# Patient Record
Sex: Female | Born: 1990 | Race: White | Hispanic: Yes | Marital: Married | Smoking: Current every day smoker
Health system: Southern US, Community
[De-identification: ages and names within clinical notes are randomized; demographics above are authoritative.]

## PROBLEM LIST (undated history)

## (undated) ENCOUNTER — Ambulatory Visit: Admission: EM | Payer: Medicaid Other

## (undated) DIAGNOSIS — R519 Headache, unspecified: Secondary | ICD-10-CM

## (undated) DIAGNOSIS — F32A Depression, unspecified: Secondary | ICD-10-CM

## (undated) DIAGNOSIS — J45909 Unspecified asthma, uncomplicated: Secondary | ICD-10-CM

## (undated) DIAGNOSIS — D259 Leiomyoma of uterus, unspecified: Secondary | ICD-10-CM

## (undated) DIAGNOSIS — F329 Major depressive disorder, single episode, unspecified: Secondary | ICD-10-CM

## (undated) DIAGNOSIS — O24419 Gestational diabetes mellitus in pregnancy, unspecified control: Secondary | ICD-10-CM

## (undated) DIAGNOSIS — G4730 Sleep apnea, unspecified: Secondary | ICD-10-CM

## (undated) DIAGNOSIS — D649 Anemia, unspecified: Secondary | ICD-10-CM

## (undated) DIAGNOSIS — F909 Attention-deficit hyperactivity disorder, unspecified type: Secondary | ICD-10-CM

## (undated) DIAGNOSIS — F419 Anxiety disorder, unspecified: Secondary | ICD-10-CM

## (undated) DIAGNOSIS — G932 Benign intracranial hypertension: Secondary | ICD-10-CM

## (undated) DIAGNOSIS — R160 Hepatomegaly, not elsewhere classified: Secondary | ICD-10-CM

## (undated) DIAGNOSIS — F429 Obsessive-compulsive disorder, unspecified: Secondary | ICD-10-CM

## (undated) DIAGNOSIS — F4310 Post-traumatic stress disorder, unspecified: Secondary | ICD-10-CM

## (undated) DIAGNOSIS — K760 Fatty (change of) liver, not elsewhere classified: Secondary | ICD-10-CM

## (undated) HISTORY — DX: Unspecified asthma, uncomplicated: J45.909

## (undated) HISTORY — DX: Depression, unspecified: F32.A

## (undated) HISTORY — DX: Anemia, unspecified: D64.9

## (undated) HISTORY — DX: Leiomyoma of uterus, unspecified: D25.9

## (undated) HISTORY — DX: Hepatomegaly, not elsewhere classified: R16.0

## (undated) HISTORY — DX: Sleep apnea, unspecified: G47.30

## (undated) HISTORY — DX: Anxiety disorder, unspecified: F41.9

---
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
## 2013-03-01 HISTORY — PX: TUBAL LIGATION: SHX77

## 2019-03-02 DIAGNOSIS — J189 Pneumonia, unspecified organism: Secondary | ICD-10-CM

## 2019-03-02 HISTORY — DX: Pneumonia, unspecified organism: J18.9

## 2019-05-28 ENCOUNTER — Encounter (HOSPITAL_COMMUNITY): Payer: Self-pay | Admitting: Emergency Medicine

## 2019-05-28 ENCOUNTER — Emergency Department (HOSPITAL_COMMUNITY)
Admission: EM | Admit: 2019-05-28 | Discharge: 2019-05-29 | Disposition: A | Discharge status: Home / Self Care | Payer: Medicaid Other | Attending: Emergency Medicine | Admitting: Emergency Medicine

## 2019-05-28 ENCOUNTER — Other Ambulatory Visit: Payer: Self-pay

## 2019-05-28 DIAGNOSIS — R1110 Vomiting, unspecified: Secondary | ICD-10-CM | POA: Diagnosis not present

## 2019-05-28 DIAGNOSIS — F17210 Nicotine dependence, cigarettes, uncomplicated: Secondary | ICD-10-CM | POA: Diagnosis not present

## 2019-05-28 DIAGNOSIS — R197 Diarrhea, unspecified: Secondary | ICD-10-CM | POA: Diagnosis not present

## 2019-05-28 DIAGNOSIS — R10817 Generalized abdominal tenderness: Secondary | ICD-10-CM | POA: Insufficient documentation

## 2019-05-28 DIAGNOSIS — R112 Nausea with vomiting, unspecified: Secondary | ICD-10-CM | POA: Diagnosis not present

## 2019-05-28 DIAGNOSIS — K769 Liver disease, unspecified: Secondary | ICD-10-CM | POA: Diagnosis not present

## 2019-05-28 DIAGNOSIS — K7689 Other specified diseases of liver: Secondary | ICD-10-CM | POA: Diagnosis not present

## 2019-05-28 LAB — CBC
HCT: 43.7 % (ref 36.0–46.0)
Hemoglobin: 14.5 g/dL (ref 12.0–15.0)
MCH: 30.5 pg (ref 26.0–34.0)
MCHC: 33.2 g/dL (ref 30.0–36.0)
MCV: 91.8 fL (ref 80.0–100.0)
Platelets: 384 10*3/uL (ref 150–400)
RBC: 4.76 MIL/uL (ref 3.87–5.11)
RDW: 13 % (ref 11.5–15.5)
WBC: 14.8 10*3/uL — ABNORMAL HIGH (ref 4.0–10.5)
nRBC: 0 % (ref 0.0–0.2)

## 2019-05-28 LAB — URINALYSIS, ROUTINE W REFLEX MICROSCOPIC
Bilirubin Urine: NEGATIVE
Glucose, UA: NEGATIVE mg/dL
Hgb urine dipstick: NEGATIVE
Ketones, ur: NEGATIVE mg/dL
Leukocytes,Ua: NEGATIVE
Nitrite: NEGATIVE
Protein, ur: 100 mg/dL — AB
Specific Gravity, Urine: 1.027 (ref 1.005–1.030)
pH: 6 (ref 5.0–8.0)

## 2019-05-28 LAB — COMPREHENSIVE METABOLIC PANEL
ALT: 66 U/L — ABNORMAL HIGH (ref 0–44)
AST: 37 U/L (ref 15–41)
Albumin: 4.4 g/dL (ref 3.5–5.0)
Alkaline Phosphatase: 64 U/L (ref 38–126)
Anion gap: 10 (ref 5–15)
BUN: 7 mg/dL (ref 6–20)
CO2: 24 mmol/L (ref 22–32)
Calcium: 9.3 mg/dL (ref 8.9–10.3)
Chloride: 102 mmol/L (ref 98–111)
Creatinine, Ser: 0.66 mg/dL (ref 0.44–1.00)
GFR calc Af Amer: >60 mL/min (ref >60)
GFR calc non Af Amer: >60 mL/min (ref >60)
Glucose, Bld: 109 mg/dL — ABNORMAL HIGH (ref 70–99)
Potassium: 4 mmol/L (ref 3.5–5.1)
Sodium: 136 mmol/L (ref 135–145)
Total Bilirubin: 0.8 mg/dL (ref 0.3–1.2)
Total Protein: 7.8 g/dL (ref 6.5–8.1)

## 2019-05-28 LAB — LIPASE, BLOOD: Lipase: 16 U/L (ref 11–51)

## 2019-05-28 LAB — I-STAT BETA HCG BLOOD, ED (MC, WL, AP ONLY): I-stat hCG, quantitative: <5 m[IU]/mL (ref <5)

## 2019-05-28 MED ORDER — SODIUM CHLORIDE 0.9% FLUSH: 3.0000 mL | Freq: Once | INTRAVENOUS | Status: DC

## 2019-05-28 NOTE — ED Triage Notes (Signed)
Patient reports upper/mid abdominal pain with emesis and diarrhea , denies fever or chills .

## 2019-05-29 ENCOUNTER — Encounter (HOSPITAL_COMMUNITY): Payer: Self-pay | Admitting: Radiology

## 2019-05-29 ENCOUNTER — Emergency Department (HOSPITAL_COMMUNITY): Payer: Medicaid Other

## 2019-05-29 DIAGNOSIS — R197 Diarrhea, unspecified: Secondary | ICD-10-CM | POA: Diagnosis not present

## 2019-05-29 DIAGNOSIS — R112 Nausea with vomiting, unspecified: Secondary | ICD-10-CM | POA: Diagnosis not present

## 2019-05-29 DIAGNOSIS — R1110 Vomiting, unspecified: Secondary | ICD-10-CM | POA: Diagnosis not present

## 2019-05-29 DIAGNOSIS — K769 Liver disease, unspecified: Secondary | ICD-10-CM | POA: Diagnosis not present

## 2019-05-29 MED ORDER — IOHEXOL 300 MG/ML  SOLN
80.0000 mL | Freq: Once | INTRAMUSCULAR | Status: AC | PRN
  Administered 2019-05-29: 80 mL via INTRAVENOUS

## 2019-05-29 MED ORDER — FAMOTIDINE 20 MG PO TABS: 20.0000 mg | ORAL_TABLET | Freq: Two times a day (BID) | ORAL | 0 refills | Status: DC

## 2019-05-29 MED ORDER — FAMOTIDINE IN NACL 20-0.9 MG/50ML-% IV SOLN
20.0000 mg | Freq: Once | INTRAVENOUS | Status: AC
  Administered 2019-05-29: 07:00:00 20 mg via INTRAVENOUS
  Filled 2019-05-29: qty 50

## 2019-05-29 MED ORDER — ONDANSETRON HCL 4 MG/2ML IJ SOLN
4.0000 mg | Freq: Once | INTRAMUSCULAR | Status: AC
  Administered 2019-05-29: 05:00:00 4 mg via INTRAVENOUS
  Filled 2019-05-29: qty 2

## 2019-05-29 MED ORDER — SODIUM CHLORIDE 0.9 % IV BOLUS
1000.0000 mL | Freq: Once | INTRAVENOUS | Status: AC
  Administered 2019-05-29: 05:00:00 1000 mL via INTRAVENOUS

## 2019-05-29 MED ORDER — ONDANSETRON 4 MG PO TBDP: 4.0000 mg | ORAL_TABLET | Freq: Three times a day (TID) | ORAL | 0 refills | Status: DC | PRN

## 2019-05-29 MED ORDER — MORPHINE SULFATE (PF) 4 MG/ML IV SOLN
4.0000 mg | Freq: Once | INTRAVENOUS | Status: AC
  Administered 2019-05-29: 4 mg via INTRAVENOUS
  Filled 2019-05-29: qty 1

## 2019-05-29 NOTE — Discharge Instructions (Signed)
Thank you for allowing me to care for you today in the Emergency Department.   Let 1 tablet of Zofran dissolve under your tongue every 8 hours as needed for nausea or vomiting.  Follow a bland diet for the next few days to help with your symptoms.  You can also take 1 tablet of Pepcid by mouth 2 times daily to help with upset stomach.  You can call the number on your discharge paperwork to get established with a primary care provider or follow-up with gastroenterology to have the area on your liver further evaluated.  Return to the emergency department if your skin turns yellow, if you have uncontrollable vomiting despite taking Zofran, if you start having high fevers, if you stop producing urine, or develop other new, concerning symptoms.

## 2019-05-29 NOTE — ED Notes (Signed)
Pt taken to CT.

## 2019-05-29 NOTE — ED Provider Notes (Signed)
[facility] EMERGENCY DEPARTMENT Provider Note   CSN: [id number] Arrival date & time: 05/28/19  2113     History Chief Complaint  Patient presents with  . Abdominal Pain    Joyce Swanson is a 29 y.o. female with no chronic medical conditions who presents to the emergency department with a chief complaint of abdominal pain.  The patient reports constant upper abdominal pain that began suddenly at 0500 yesterday, almost 24 hours ago.  Pain is nonradiating, but she does feel as if it intermittently moves all over her abdomen.  She characterizes the pain as crampy and sharp.  She was able to keep down [place] fries earlier today without vomiting.  Pain did not seem to be worse after eating.  No other known aggravating or alleviating factors.  She reports 2 episodes of nonbilious vomiting.  She reports that the second episode contained a few streaks of bright red blood.  She has also had chills since onset of symptoms as well as nonbloody diarrhea for the last couple of days.  No fever, dysuria, back pain, constipation, cough, shortness of breath, or URI symptoms.  No melena or hematochezia.  She has been having vaginal discharge, but reports that this is her baseline.  No concerns for STIs and the patient has not been sexually active since December 2020.  No treatment prior to arrival.  No suspicious food intake.  No sick contacts.   She is a current, every day smoker.  She denies recent NSAID use.  Denies recent alcohol use.  No other illicit or recreational substance use.  Surgical history includes cesarean section.  No language interpreter was used.       History reviewed. No pertinent past medical history.  There are no problems to display for this patient.   History reviewed. No pertinent surgical history.   OB History   No obstetric history on file.     No family history on file.  Social History   Tobacco Use  . Smoking status: Current Every Day Smoker    . Smokeless tobacco: Never Used  Substance Use Topics  . Alcohol use: Yes  . Drug use: Yes    Home Medications Prior to Admission medications   Medication Sig Start Date End Date Taking? Authorizing Provider  famotidine (PEPCID) 20 MG tablet Take 1 tablet (20 mg total) by mouth 2 (two) times daily. 05/29/19   Xochitl Egle A, PA-C  ondansetron (ZOFRAN ODT) 4 MG disintegrating tablet Take 1 tablet (4 mg total) by mouth every 8 (eight) hours as needed. 05/29/19   Magaline Steinberg A, PA-C    Allergies    Patient has no known allergies.  Review of Systems   Review of Systems  Constitutional: Positive for chills. Negative for activity change, diaphoresis and fever.  HENT: Negative for congestion and sore throat.   Respiratory: Negative for shortness of breath and wheezing.   Cardiovascular: Negative for chest pain.  Gastrointestinal: Positive for abdominal pain, diarrhea, nausea and vomiting.  Genitourinary: Positive for vaginal discharge (chronic). Negative for decreased urine volume, dysuria, flank pain, frequency, hematuria, menstrual problem and urgency.  Musculoskeletal: Negative for back pain, gait problem, myalgias, neck pain and neck stiffness.  Skin: Negative for rash.  Allergic/Immunologic: Negative for immunocompromised state.  Neurological: Negative for dizziness, seizures, syncope, weakness, numbness and headaches.  Psychiatric/Behavioral: Negative for confusion.    Physical Exam Updated Vital Signs BP (!) 125/96   Pulse 82   Temp 98.2 F (36.8 C) (Oral)  Resp 17   Ht 5\' 1"  (1.549 m)   Wt 75 kg   LMP 05/14/2019   SpO2 97%   BMI 31.24 kg/m   Physical Exam Vitals and nursing note reviewed.  Constitutional:      General: She is not in acute distress. HENT:     Head: Normocephalic.  Eyes:     Conjunctiva/sclera: Conjunctivae normal.  Neck:     Vascular: No carotid bruit.  Cardiovascular:     Rate and Rhythm: Normal rate and regular rhythm.     Heart sounds:  No murmur. No friction rub. No gallop.   Pulmonary:     Effort: Pulmonary effort is normal. No respiratory distress.     Breath sounds: No stridor. No wheezing, rhonchi or rales.  Chest:     Chest wall: No tenderness.  Abdominal:     General: There is no distension.     Palpations: Abdomen is soft.     Tenderness: There is abdominal tenderness.     Comments: Diffusely tender to palpation throughout the abdomen.  Maximal tenderness is in the epigastric region.  Abdomen is soft and nondistended.  No rebound or guarding.  Hypoactive bowel sounds in all 4 quadrants.  Negative Murphy sign.  No focal tenderness over McBurney's point.  Left CVA tenderness.  No right CVA tenderness.  Musculoskeletal:     Cervical back: Normal range of motion and neck supple. No rigidity or tenderness.  Lymphadenopathy:     Cervical: No cervical adenopathy.  Skin:    General: Skin is warm.     Coloration: Skin is not jaundiced.     Findings: No rash.  Neurological:     Mental Status: She is alert.  Psychiatric:        Behavior: Behavior normal.     ED Results / Procedures / Treatments   Labs (all labs ordered are listed, but only abnormal results are displayed) Labs Reviewed  COMPREHENSIVE METABOLIC PANEL - Abnormal; Notable for the following components:      Result Value   Glucose, Bld 109 (*)    ALT 66 (*)    All other components within normal limits  CBC - Abnormal; Notable for the following components:   WBC 14.8 (*)    All other components within normal limits  URINALYSIS, ROUTINE W REFLEX MICROSCOPIC - Abnormal; Notable for the following components:   APPearance HAZY (*)    Protein, ur 100 (*)    Bacteria, UA RARE (*)    All other components within normal limits  LIPASE, BLOOD  I-STAT BETA HCG BLOOD, ED ([facility], [facility], [facility] ONLY)    EKG None  Radiology CT ABDOMEN PELVIS W CONTRAST  Result Date: 05/29/2019 CLINICAL DATA:  Nausea and vomiting. Intermittent upper and lower abdominal pain for 3  days. EXAM: CT ABDOMEN AND PELVIS WITH CONTRAST TECHNIQUE: Multidetector CT imaging of the abdomen and pelvis was performed using the standard protocol following bolus administration of intravenous contrast. CONTRAST:  86mL OMNIPAQUE IOHEXOL 300 MG/ML  SOLN COMPARISON:  None. FINDINGS: Lower chest:  No contributory findings. Hepatobiliary: Marked hepatic steatosis. There is a rounded hypervascular area in the upper left liver measuring 15 mm. No noted cirrhotic changes. No evidence of biliary obstruction or stone. Pancreas: Unremarkable. Spleen: Unremarkable. Adrenals/Urinary Tract: Negative adrenals. No hydronephrosis or stone. Unremarkable bladder. Stomach/Bowel:  No obstruction. No appendicitis. Vascular/Lymphatic: No acute vascular abnormality. No mass or adenopathy. Reproductive:Somewhat globular appearance of the uterus which may be from adenomyosis or recent delivery. There  may have likely been a prior Caesarean section. Other: No ascites or pneumoperitoneum. Musculoskeletal: No acute abnormalities. IMPRESSION: No acute finding. Hypervascular nodule in the upper left liver measuring 15 mm. Based on demographics this usually reflects hemangioma or adenoma. There is marked hepatic steatosis and follow-up is recommended. Electronically Signed   By: Monte Fantasia M.D.   On: 05/29/2019 05:29    Procedures Procedures (including critical care time)  Medications Ordered in ED Medications  sodium chloride flush (NS) 0.9 % injection 3 mL (3 mLs Intravenous Not Given 05/29/19 0317)  sodium chloride 0.9 % bolus 1,000 mL (0 mLs Intravenous Stopped 05/29/19 0635)  ondansetron (ZOFRAN) injection 4 mg (4 mg Intravenous Given 05/29/19 0439)  morphine 4 MG/ML injection 4 mg (4 mg Intravenous Given 05/29/19 0439)  iohexol (OMNIPAQUE) 300 MG/ML solution 80 mL (80 mLs Intravenous Contrast Given 05/29/19 0438)  famotidine (PEPCID) IVPB 20 mg premix (20 mg Intravenous New Bag/Given 05/29/19 [id number])    ED Course  I have  reviewed the triage vital signs and the nursing notes.  Pertinent labs & imaging results that were available during my care of the patient were reviewed by me and considered in my medical decision making (see chart for details).    MDM Rules/Calculators/A&P                       29 year old female with no significant past medical history presenting with nausea, vomiting, diarrhea, chills, and abdominal pain for the last 24 hours.  Mildly tachycardic on arrival with mild hypertension, but she is afebrile with no tachypnea or hypoxia.  On exam, she is diffusely tender to palpation throughout the abdomen without rebound or guarding.  No GU complaints.  Labs are notable for minimally elevated ALT and leukocytosis of 14.8. UA with mild proteinuria.  Not concerning for infection.  CT abdomen pelvis demonstrating a hypervascular nodule in the left upper liver measuring 15 mm concerning for hemangioma versus adenoma.  There is also marked hepatic steatosis and follow-up is recommended.  The patient reports that she just recently moved to the area from [facility].  She reports that she had a CT abdomen pelvis performed within the last year and does not recall any abnormalities of the lungs there are mentioned on the read.  Unfortunately, these records are not available in care everywhere.  On reevaluation, tachycardia has resolved.  Patient reports that she is feeling much better after IV fluid bolus.  She has been successfully fluid challenge.  Discussed that symptoms could be a viral gastroenterologist given the family been ongoing for 24 hours, but could also be COVID-19.  She declined COVID-19 testing in the ER.  Doubt pyelonephritis, PID, appendicitis, cholecystitis, pancreatitis, ectopic pregnancy, or ovarian torsion.  Will discharge with symptomatic care and gastroenterology referral for follow-up.  She is also looking to get established with a PCP that she recently relocated to the area.  All  questions answered.  She is hemodynamically stable and in no acute distress.  Safe for discharge to home with outpatient follow-up.  Final Clinical Impression(s) / ED Diagnoses Final diagnoses:  Nausea vomiting and diarrhea  Liver nodule    Rx / DC Orders ED Discharge Orders         Ordered    ondansetron (ZOFRAN ODT) 4 MG disintegrating tablet  Every 8 hours PRN     05/29/19 0714    famotidine (PEPCID) 20 MG tablet  2 times daily  05/29/19 Grimsley, Legacy Carrender A, PA-C 05/29/19 [id number]    Merrily Pew, MD 05/30/19 [id number]

## 2019-05-29 NOTE — ED Notes (Signed)
Pt tolerated PO water 

## 2019-05-29 NOTE — ED Notes (Signed)
Patient verbalizes understanding of discharge instructions. Opportunity for questioning and answers were provided. Armband removed by staff, pt discharged from ED. Ambulated out to lobby  

## 2019-06-19 NOTE — Progress Notes (Deleted)
    New patient visit    Patient: Joyce Swanson   DOB: 06-17-1990   29 y.o. Female  MRN: [id number] Visit Date: 06/19/2019  Today's healthcare provider: Marcille Buffy, FNP  Subjective:   No chief complaint on file.   Joyce Swanson is a 29 y.o. female who presents today as a new patient to establish care.  HPI  ***  No past medical history on file. No past surgical history on file. No family status information on file.   No family history on file. Social History   Socioeconomic History  . Marital status: Divorced    Spouse name: Not on file  . Number of children: Not on file  . Years of education: Not on file  . Highest education level: Not on file  Occupational History  . Not on file  Tobacco Use  . Smoking status: Current Every Day Smoker  . Smokeless tobacco: Never Used  Substance and Sexual Activity  . Alcohol use: Yes  . Drug use: Yes  . Sexual activity: Not on file  Other Topics Concern  . Not on file  Social History Narrative  . Not on file   Social Determinants of Health   Financial Resource Strain:   . Difficulty of Paying Living Expenses:   Food Insecurity:   . Worried About Charity fundraiser in the Last Year:   . Arboriculturist in the Last Year:   Transportation Needs:   . Film/video editor (Medical):   Marland Kitchen Lack of Transportation (Non-Medical):   Physical Activity:   . Days of Exercise per Week:   . Minutes of Exercise per Session:   Stress:   . Feeling of Stress :   Social Connections:   . Frequency of Communication with Friends and Family:   . Frequency of Social Gatherings with Friends and Family:   . Attends Religious Services:   . Active Member of Clubs or Organizations:   . Attends Archivist Meetings:   Marland Kitchen Marital Status:    Outpatient Medications Prior to Visit  Medication Sig  . famotidine (PEPCID) 20 MG tablet Take 1 tablet (20 mg total) by mouth 2 (two) times daily.  . ondansetron (ZOFRAN ODT) 4 MG  disintegrating tablet Take 1 tablet (4 mg total) by mouth every 8 (eight) hours as needed.   No facility-administered medications prior to visit.   No Known Allergies  Patient Care Team: Patient, No Pcp Per as PCP - General (General Practice)  Review of Systems  {Show previous labs (optional):23779::" "}   Objective:    There were no vitals taken for this visit. Physical Exam ***  No results found for any visits on 06/25/19.    Assessment & Plan:    ***  No follow-ups on file.     {provider attestation***:1}   Marcille Buffy, [place] [phone] (phone) [phone] (fax)  [place]

## 2019-06-25 ENCOUNTER — Ambulatory Visit: Payer: Medicaid Other | Admitting: Adult Health

## 2019-07-10 ENCOUNTER — Ambulatory Visit: Payer: Medicaid Other | Admitting: Adult Health

## 2019-07-10 ENCOUNTER — Encounter: Payer: Self-pay | Admitting: Adult Health

## 2019-07-10 ENCOUNTER — Other Ambulatory Visit: Payer: Self-pay | Admitting: Gastroenterology

## 2019-07-10 ENCOUNTER — Other Ambulatory Visit: Payer: Self-pay

## 2019-07-10 VITALS — BP 136/94 | HR 88 | Temp 96.9°F | Resp 16 | Ht 61.0 in | Wt 155.4 lb

## 2019-07-10 DIAGNOSIS — Z862 Personal history of diseases of the blood and blood-forming organs and certain disorders involving the immune mechanism: Secondary | ICD-10-CM

## 2019-07-10 DIAGNOSIS — N920 Excessive and frequent menstruation with regular cycle: Secondary | ICD-10-CM | POA: Diagnosis not present

## 2019-07-10 DIAGNOSIS — R030 Elevated blood-pressure reading, without diagnosis of hypertension: Secondary | ICD-10-CM | POA: Diagnosis not present

## 2019-07-10 DIAGNOSIS — Z13220 Encounter for screening for lipoid disorders: Secondary | ICD-10-CM | POA: Diagnosis not present

## 2019-07-10 DIAGNOSIS — Z0000 Encounter for general adult medical examination without abnormal findings: Secondary | ICD-10-CM | POA: Diagnosis not present

## 2019-07-10 DIAGNOSIS — F419 Anxiety disorder, unspecified: Secondary | ICD-10-CM | POA: Diagnosis not present

## 2019-07-10 DIAGNOSIS — F322 Major depressive disorder, single episode, severe without psychotic features: Secondary | ICD-10-CM

## 2019-07-10 DIAGNOSIS — E559 Vitamin D deficiency, unspecified: Secondary | ICD-10-CM | POA: Insufficient documentation

## 2019-07-10 DIAGNOSIS — R45851 Suicidal ideations: Secondary | ICD-10-CM

## 2019-07-10 DIAGNOSIS — F4310 Post-traumatic stress disorder, unspecified: Secondary | ICD-10-CM | POA: Diagnosis not present

## 2019-07-10 DIAGNOSIS — R140 Abdominal distension (gaseous): Secondary | ICD-10-CM | POA: Diagnosis not present

## 2019-07-10 DIAGNOSIS — K769 Liver disease, unspecified: Secondary | ICD-10-CM | POA: Diagnosis not present

## 2019-07-10 DIAGNOSIS — Z86018 Personal history of other benign neoplasm: Secondary | ICD-10-CM | POA: Diagnosis not present

## 2019-07-10 DIAGNOSIS — R1084 Generalized abdominal pain: Secondary | ICD-10-CM | POA: Diagnosis not present

## 2019-07-10 DIAGNOSIS — Z1329 Encounter for screening for other suspected endocrine disorder: Secondary | ICD-10-CM | POA: Diagnosis not present

## 2019-07-10 DIAGNOSIS — R5383 Other fatigue: Secondary | ICD-10-CM | POA: Diagnosis not present

## 2019-07-10 DIAGNOSIS — Z91419 Personal history of unspecified adult abuse: Secondary | ICD-10-CM

## 2019-07-10 LAB — POCT URINALYSIS DIPSTICK
Bilirubin, UA: NEGATIVE
Glucose, UA: NEGATIVE
Ketones, UA: NEGATIVE
Leukocytes, UA: NEGATIVE
Nitrite, UA: NEGATIVE
Protein, UA: NEGATIVE
Spec Grav, UA: 1.01 (ref 1.010–1.025)
Urobilinogen, UA: 0.2 E.U./dL
pH, UA: 8.5 — AB (ref 5.0–8.0)

## 2019-07-10 NOTE — Patient Instructions (Signed)
Psychiatric/Counseling Resources Discussed As Follows:  If Emergency please seek Emergency Room Care Immediately or Call 911.   [facility] Address:  [place], [place] [address] Phone: [phone] Website : [email or web address]   [facility] Address:  [address] [place], [place] [address] Phone: [phone] Fax: [phone] Website: [email or web address] How To Access Our Services Because our main goal is to meet the needs of our consumers, [facility] operates on a walk-in basis! To access services, there are just 3 easy steps: 1) Walk in any Monday, Wednesday or Friday between 8:00 am and 3:00 pm and complete our consumer paperwork 2) A Comprehensive Clinical Assessment (CCA) will be completed and appropriate service recommendations will be provided 3) Recommendations are sent to [facility] team members and the appropriate staff will call you within days. Advanced Access Open M - F, 8:00 am - 8:00 pm  Mental health crisis services for all age groups  Triage  Psychiatric Evaluations  Involuntary Commitments  [facility]  Address: [address], [place], [place] [address] Website : [email or web address] Walk in's accepted see web site or call for more information Phone : [phone] Also has [facility] [phone]    Psychology Today Find a therapist by searching online in your area or specialist by your diagnosis Website:  https://www.psychologytoday.com/us     Major Depressive Disorder, Adult Major depressive disorder (MDD) is a mental health condition. MDD often makes you feel sad, hopeless, or helpless. MDD can also cause symptoms in your body. MDD can affect your:  Work.  School.  Relationships.  Other normal activities. MDD can range from mild to very bad. It may occur once (single episode MDD). It can also occur many times (recurrent  MDD). The main symptoms of MDD often include:  Feeling sad, depressed, or irritable most of the time.  Loss of interest. MDD symptoms also include:  Sleeping too much or too little.  Eating too much or too little.  A change in your weight.  Feeling tired (fatigue) or having low energy.  Feeling worthless.  Feeling guilty.  Trouble making decisions.  Trouble thinking clearly.  Thoughts of suicide or harming others.  Feeling weak.  Feeling agitated.  Keeping yourself from being around other people (isolation). Follow these instructions at home: Activity  Do these things as told by your doctor: ? Go back to your normal activities. ? Exercise regularly. ? Spend time outdoors. Alcohol  Talk with your doctor about how alcohol can affect your antidepressant medicines.  Do not drink alcohol. Or, limit how much alcohol you drink. ? This means no more than 1 drink a day for nonpregnant women and 2 drinks a day for men. One drink equals one of these:  12 oz of beer.  5 oz of wine.  1 oz of hard liquor. General instructions  Take over-the-counter and prescription medicines only as told by your doctor.  Eat a healthy diet.  Get plenty of sleep.  Find activities that you enjoy. Make time to do them.  Think about joining a support group. Your doctor may be able to suggest a group for you.  Keep all follow-up visits as told by your doctor. This is important. Where to find more information:  [employer] on Mental Illness: ? www.nami.[place]: ? https://carter.com/  National Suicide Prevention Lifeline: ? 813-353-1869. This is free, 24-hour help. Contact a doctor if:  Your symptoms get worse.  You have  new symptoms. Get help right away if:  You self-harm.  You see, hear, taste, smell, or feel things that are not present (hallucinate). If you ever feel like you may hurt yourself or others, or have thoughts  about taking your own life, get help right away. You can go to your nearest emergency department or call:  Your local emergency services (911 in the [place]  A suicide crisis helpline, such as the National Suicide Prevention Lifeline: ? (325)115-5453. This is open 24 hours a day. This information is not intended to replace advice given to you by your health care provider. Make sure you discuss any questions you have with your health care provider. Document Revised: 01/28/2017 Document Reviewed: 11/02/2015 Elsevier Patient Education  2020 [employer].

## 2019-07-10 NOTE — Progress Notes (Deleted)
     Established patient visit   Patient: Joyce Swanson   DOB: Feb 13, 1991   29 y.o. Female  MRN: [id number] Visit Date: 07/10/2019  Today's healthcare provider: Marcille Buffy, FNP   No chief complaint on file.  Subjective    HPI ***  {Show patient history (optional):23778::" "}   Medications: Outpatient Medications Prior to Visit  Medication Sig  . famotidine (PEPCID) 20 MG tablet Take 1 tablet (20 mg total) by mouth 2 (two) times daily.  . ondansetron (ZOFRAN ODT) 4 MG disintegrating tablet Take 1 tablet (4 mg total) by mouth every 8 (eight) hours as needed.   No facility-administered medications prior to visit.    Review of Systems  Constitutional: Positive for activity change, appetite change, chills and fatigue.  HENT: Positive for congestion, dental problem, ear pain, facial swelling, sinus pressure, sneezing and sore throat.   Eyes: Positive for photophobia and itching.  Respiratory: Positive for apnea and cough.   Gastrointestinal: Positive for abdominal distention and abdominal pain.  Endocrine: Positive for cold intolerance.  Genitourinary: Positive for enuresis and vaginal discharge.  Musculoskeletal: Positive for back pain, myalgias, neck pain and neck stiffness.  Allergic/Immunologic: Positive for environmental allergies.  Neurological: Positive for weakness, light-headedness, numbness and headaches.  Psychiatric/Behavioral: Positive for agitation, confusion, decreased concentration, dysphoric mood and sleep disturbance. The patient is nervous/anxious.   All other systems reviewed and are negative.   {Show previous labs (optional):23779::" "}  Objective    There were no vitals taken for this visit. {Show previous vital signs (optional):23777::" "}  Physical Exam  ***  No results found for any visits on 07/10/19.  Assessment & Plan     ***  No follow-ups on file.      {provider attestation***:1}   Marcille Buffy, [place] [phone] (phone) [phone] (fax)  [place]
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
## 2019-07-11 ENCOUNTER — Telehealth: Payer: Self-pay | Admitting: Obstetrics and Gynecology

## 2019-07-11 DIAGNOSIS — K769 Liver disease, unspecified: Secondary | ICD-10-CM | POA: Diagnosis not present

## 2019-07-11 DIAGNOSIS — R1084 Generalized abdominal pain: Secondary | ICD-10-CM | POA: Diagnosis not present

## 2019-07-11 DIAGNOSIS — R140 Abdominal distension (gaseous): Secondary | ICD-10-CM | POA: Diagnosis not present

## 2019-07-11 NOTE — Telephone Encounter (Signed)
[facility] referring for Menorrhagia with regular cycle, History of uterine fibroid, est care. Desires female provider. Called and left voicemail for patient to call back to be scheduled.

## 2019-07-12 ENCOUNTER — Other Ambulatory Visit: Payer: Self-pay | Admitting: Gastroenterology

## 2019-07-12 DIAGNOSIS — K769 Liver disease, unspecified: Secondary | ICD-10-CM

## 2019-07-12 NOTE — Telephone Encounter (Signed)
Called and left voicemail for patient to call back to be scheduled. 

## 2019-07-12 NOTE — Addendum Note (Signed)
Addended by: Doreen Beam on: 07/12/2019 04:30 PM   Modules accepted: Level of Service

## 2019-07-16 DIAGNOSIS — R1084 Generalized abdominal pain: Secondary | ICD-10-CM | POA: Diagnosis not present

## 2019-07-16 DIAGNOSIS — R140 Abdominal distension (gaseous): Secondary | ICD-10-CM | POA: Diagnosis not present

## 2019-07-27 DIAGNOSIS — Z1159 Encounter for screening for other viral diseases: Secondary | ICD-10-CM | POA: Diagnosis not present

## 2019-08-01 DIAGNOSIS — R768 Other specified abnormal immunological findings in serum: Secondary | ICD-10-CM | POA: Diagnosis not present

## 2019-08-01 DIAGNOSIS — K2210 Ulcer of esophagus without bleeding: Secondary | ICD-10-CM | POA: Diagnosis not present

## 2019-08-01 DIAGNOSIS — K200 Eosinophilic esophagitis: Secondary | ICD-10-CM | POA: Diagnosis not present

## 2019-08-01 DIAGNOSIS — K3189 Other diseases of stomach and duodenum: Secondary | ICD-10-CM | POA: Diagnosis not present

## 2019-08-01 DIAGNOSIS — R140 Abdominal distension (gaseous): Secondary | ICD-10-CM | POA: Diagnosis not present

## 2019-08-01 DIAGNOSIS — K222 Esophageal obstruction: Secondary | ICD-10-CM | POA: Diagnosis not present

## 2019-08-01 DIAGNOSIS — R1084 Generalized abdominal pain: Secondary | ICD-10-CM | POA: Diagnosis not present

## 2019-08-01 DIAGNOSIS — K449 Diaphragmatic hernia without obstruction or gangrene: Secondary | ICD-10-CM | POA: Diagnosis not present

## 2019-08-01 DIAGNOSIS — K228 Other specified diseases of esophagus: Secondary | ICD-10-CM | POA: Diagnosis not present

## 2019-08-03 ENCOUNTER — Ambulatory Visit (INDEPENDENT_AMBULATORY_CARE_PROVIDER_SITE_OTHER): Payer: Medicaid Other | Admitting: Obstetrics and Gynecology

## 2019-08-03 ENCOUNTER — Encounter: Payer: Self-pay | Admitting: Obstetrics and Gynecology

## 2019-08-03 ENCOUNTER — Other Ambulatory Visit (HOSPITAL_COMMUNITY)
Admission: RE | Admit: 2019-08-03 | Discharge: 2019-08-03 | Disposition: A | Discharge status: Home / Self Care | Payer: Medicaid Other | Source: Ambulatory Visit | Attending: Obstetrics and Gynecology | Admitting: Obstetrics and Gynecology

## 2019-08-03 ENCOUNTER — Other Ambulatory Visit: Payer: Self-pay

## 2019-08-03 VITALS — BP 110/70 | Ht 61.0 in | Wt 149.2 lb

## 2019-08-03 DIAGNOSIS — D259 Leiomyoma of uterus, unspecified: Secondary | ICD-10-CM

## 2019-08-03 DIAGNOSIS — N920 Excessive and frequent menstruation with regular cycle: Secondary | ICD-10-CM | POA: Diagnosis not present

## 2019-08-03 DIAGNOSIS — Z124 Encounter for screening for malignant neoplasm of cervix: Secondary | ICD-10-CM | POA: Insufficient documentation

## 2019-08-03 NOTE — Patient Instructions (Signed)

## 2019-08-03 NOTE — Progress Notes (Signed)
Patient ID: Joyce Swanson, female   DOB: 1990/12/24, 29 y.o.   MRN: [id number]  Reason for Consult: Gynecologic Exam   Referred by Doreen Beam, F*  Subjective:     HPI:  Joyce Swanson is a 29 y.o. female.  She presents today with complaints of menorrhagia.  She reports that she has been having heavier menstrual cycles for the last 6 months.  She reports that she has been passing palm-sized blood clots having severe pain.  She reports that she had an ultrasound in [place] which showed that she had a enlarged fibroid uterus.  She did not have treatment for these fibroids in [place] because she recently moved here to [place]. She denies gushing or fluttering sensations of bleeding.  She denies having any accidents where the bleeding caused her to need to change her clothes.  She reports that she has occasionally missed work because of her heavy bleeding and pain on her periods.  She has no history of needing blood transfusions.  She does have a history of anemia.  Her most recent hemoglobin was within normal range.  She reports that she is sexually active.  She has no pain or bleeding with intercourse.  She has undergone a tubal ligation in the past.  She reports that she no longer desires to have children.  Gynecological History Menarche: 11 Menopause: Not applicable LMP: 0/53/9767 Describes periods as heavy Last pap smear: Reports last Pap smear was in October in [place] and was normal. History of STDs: Denies Sexually Active: Yes  Denies history of polyps or ovarian cyst.  Denies history of endometriosis or PCOS.  Obstetrical History G3 P3 History of 2 cesarean sections and 1 vaginal delivery.  Reports that she had gestational diabetes with every pregnancy.  She reports her pregnancies were also complicated by anemia.  Past Medical History:  Diagnosis Date  . Anemia   . Anxiety   . Asthma   . Depression   . Fibroid, uterine   . Liver mass   . Sleep apnea    Family  History  Problem Relation Age of Onset  . Diabetes Father   . Diabetes Paternal Aunt   . Diabetes Paternal Uncle   . Diabetes Paternal Grandmother    History reviewed. No pertinent surgical history.  Short Social History:  Social History   Tobacco Use  . Smoking status: Current Every Day Smoker  . Smokeless tobacco: Never Used  Substance Use Topics  . Alcohol use: Yes    No Known Allergies  Current Outpatient Medications  Medication Sig Dispense Refill  . pantoprazole (PROTONIX) 40 MG tablet Take 40 mg by mouth daily.     No current facility-administered medications for this visit.    Review of Systems  Constitutional: Negative for chills, fatigue, fever and unexpected weight change.  HENT: Negative for trouble swallowing.  Eyes: Negative for loss of vision.  Respiratory: Positive for cough. Negative for shortness of breath and wheezing.  Cardiovascular: Negative for chest pain, leg swelling, palpitations and syncope.  GI: Negative for abdominal pain, blood in stool, diarrhea, nausea and vomiting.  GU: Negative for difficulty urinating, dysuria, frequency and hematuria.  Musculoskeletal: Negative for back pain, leg pain and joint pain.  Skin: Negative for rash.  Neurological: Positive for dizziness, headaches and numbness. Negative for light-headedness and seizures.  Psychiatric: Positive for depressed mood. Negative for behavioral problem, confusion and sleep disturbance.        Objective:  Objective   Vitals:  08/03/19 1442  BP: 110/70  Weight: 149 lb 3.2 oz (67.7 kg)  Height: 5\' 1"  (1.549 m)   Body mass index is 28.19 kg/m.  Physical Exam Vitals and nursing note reviewed.  Constitutional:      Appearance: She is well-developed.  HENT:     Head: Normocephalic and atraumatic.  Eyes:     Pupils: Pupils are equal, round, and reactive to light.  Cardiovascular:     Rate and Rhythm: Normal rate and regular rhythm.  Pulmonary:     Effort: Pulmonary  effort is normal. No respiratory distress.  Genitourinary:    Comments: External: Vulva normal. No lesions noted.  Speculum examination:  Cervix normal . No blood in the vaginal vault. no discharge.  Bimanual examination: Uterus midline, enlarged fibroid uterus approximately 12-14 cm in size. Mobile. No adnexal masses. Some small CMT. Pelvis is not fixed. Skin:    General: Skin is warm and dry.  Neurological:     Mental Status: She is alert and oriented to person, place, and time.  Psychiatric:        Behavior: Behavior normal.        Thought Content: Thought content normal.        Judgment: Judgment normal.       Assessment/Plan:     29 year old with enlarged fibroid uterus and history of menorrhagia We will have patient return for a pelvic ultrasound to define the size and shape and location of her uterine fibroids. Discussed treatment options for fibroids including hormonal contraception, myomectomy, uterine fibroid embolization, and hysterectomy.  Discussed that recommendations for treatment will be based off of her desires as well as what is feasible with the size and location of the uterine fibroids.  We will discuss treatment options further at her next appointment.  Patient was provided with handouts with further information regarding uterine fibroids.  More than 30 minutes were spent face to face with the patient in the room, reviewing the medical record, labs and images, and coordinating care for the patient. The plan of management was discussed in detail and counseling was provided.   Adrian Prows MD [facility], [facility] 08/03/2019 3:27 PM

## 2019-08-09 LAB — CYTOLOGY - PAP
Comment: NORMAL
Diagnosis: NEGATIVE
Neisseria Gonorrhea: NEGATIVE

## 2019-08-11 ENCOUNTER — Other Ambulatory Visit: Payer: Self-pay

## 2019-08-11 ENCOUNTER — Ambulatory Visit
Admission: RE | Admit: 2019-08-11 | Discharge: 2019-08-11 | Disposition: A | Discharge status: Home / Self Care | Payer: Medicaid Other | Source: Ambulatory Visit | Attending: Gastroenterology | Admitting: Gastroenterology

## 2019-08-11 DIAGNOSIS — K7689 Other specified diseases of liver: Secondary | ICD-10-CM | POA: Diagnosis not present

## 2019-08-11 DIAGNOSIS — K760 Fatty (change of) liver, not elsewhere classified: Secondary | ICD-10-CM | POA: Diagnosis not present

## 2019-08-11 DIAGNOSIS — K769 Liver disease, unspecified: Secondary | ICD-10-CM

## 2019-08-11 MED ORDER — GADOBENATE DIMEGLUMINE 529 MG/ML IV SOLN
14.0000 mL | Freq: Once | INTRAVENOUS | Status: AC | PRN
  Administered 2019-08-11: 14 mL via INTRAVENOUS

## 2019-08-20 ENCOUNTER — Other Ambulatory Visit: Payer: Medicaid Other

## 2019-08-20 ENCOUNTER — Ambulatory Visit: Payer: Medicaid Other | Admitting: Obstetrics and Gynecology

## 2019-09-11 ENCOUNTER — Other Ambulatory Visit: Payer: Self-pay

## 2019-09-11 ENCOUNTER — Ambulatory Visit (INDEPENDENT_AMBULATORY_CARE_PROVIDER_SITE_OTHER): Payer: Medicaid Other

## 2019-09-11 ENCOUNTER — Ambulatory Visit (INDEPENDENT_AMBULATORY_CARE_PROVIDER_SITE_OTHER): Payer: Medicaid Other | Admitting: Obstetrics and Gynecology

## 2019-09-11 ENCOUNTER — Encounter: Payer: Self-pay | Admitting: Obstetrics and Gynecology

## 2019-09-11 ENCOUNTER — Other Ambulatory Visit: Payer: Self-pay | Admitting: Obstetrics and Gynecology

## 2019-09-11 VITALS — BP 100/70 | Resp 16 | Ht 61.0 in | Wt 146.8 lb

## 2019-09-11 DIAGNOSIS — D259 Leiomyoma of uterus, unspecified: Secondary | ICD-10-CM

## 2019-09-11 DIAGNOSIS — N920 Excessive and frequent menstruation with regular cycle: Secondary | ICD-10-CM | POA: Diagnosis not present

## 2019-09-11 NOTE — Progress Notes (Signed)
Patient left before being seen. When called in evening was not able to be reached. Left message to return call tomorrow or Friday.   Adrian Prows MD, [facility], [facility] 09/11/2019 5:52 PM

## 2019-09-24 DIAGNOSIS — F410 Panic disorder [episodic paroxysmal anxiety] without agoraphobia: Secondary | ICD-10-CM | POA: Diagnosis not present

## 2019-09-24 DIAGNOSIS — F4312 Post-traumatic stress disorder, chronic: Secondary | ICD-10-CM | POA: Diagnosis not present

## 2019-09-24 DIAGNOSIS — F332 Major depressive disorder, recurrent severe without psychotic features: Secondary | ICD-10-CM | POA: Diagnosis not present

## 2019-10-15 DIAGNOSIS — F900 Attention-deficit hyperactivity disorder, predominantly inattentive type: Secondary | ICD-10-CM | POA: Diagnosis not present

## 2019-10-22 DIAGNOSIS — F411 Generalized anxiety disorder: Secondary | ICD-10-CM | POA: Diagnosis not present

## 2019-10-22 DIAGNOSIS — F4312 Post-traumatic stress disorder, chronic: Secondary | ICD-10-CM | POA: Diagnosis not present

## 2019-10-22 DIAGNOSIS — F410 Panic disorder [episodic paroxysmal anxiety] without agoraphobia: Secondary | ICD-10-CM | POA: Diagnosis not present

## 2019-10-22 DIAGNOSIS — F332 Major depressive disorder, recurrent severe without psychotic features: Secondary | ICD-10-CM | POA: Diagnosis not present

## 2019-11-28 DIAGNOSIS — F411 Generalized anxiety disorder: Secondary | ICD-10-CM | POA: Diagnosis not present

## 2019-11-28 DIAGNOSIS — F410 Panic disorder [episodic paroxysmal anxiety] without agoraphobia: Secondary | ICD-10-CM | POA: Diagnosis not present

## 2019-11-28 DIAGNOSIS — F900 Attention-deficit hyperactivity disorder, predominantly inattentive type: Secondary | ICD-10-CM | POA: Diagnosis not present

## 2019-11-28 DIAGNOSIS — F332 Major depressive disorder, recurrent severe without psychotic features: Secondary | ICD-10-CM | POA: Diagnosis not present

## 2019-12-19 DIAGNOSIS — F410 Panic disorder [episodic paroxysmal anxiety] without agoraphobia: Secondary | ICD-10-CM | POA: Diagnosis not present

## 2019-12-19 DIAGNOSIS — F900 Attention-deficit hyperactivity disorder, predominantly inattentive type: Secondary | ICD-10-CM | POA: Diagnosis not present

## 2019-12-19 DIAGNOSIS — F3342 Major depressive disorder, recurrent, in full remission: Secondary | ICD-10-CM | POA: Diagnosis not present

## 2019-12-19 DIAGNOSIS — F411 Generalized anxiety disorder: Secondary | ICD-10-CM | POA: Diagnosis not present

## 2020-04-07 DIAGNOSIS — F410 Panic disorder [episodic paroxysmal anxiety] without agoraphobia: Secondary | ICD-10-CM | POA: Diagnosis not present

## 2020-04-07 DIAGNOSIS — F900 Attention-deficit hyperactivity disorder, predominantly inattentive type: Secondary | ICD-10-CM | POA: Diagnosis not present

## 2020-04-07 DIAGNOSIS — F411 Generalized anxiety disorder: Secondary | ICD-10-CM | POA: Diagnosis not present

## 2020-04-07 DIAGNOSIS — F332 Major depressive disorder, recurrent severe without psychotic features: Secondary | ICD-10-CM | POA: Diagnosis not present

## 2020-04-07 DIAGNOSIS — F4312 Post-traumatic stress disorder, chronic: Secondary | ICD-10-CM | POA: Diagnosis not present

## 2020-04-22 ENCOUNTER — Ambulatory Visit (INDEPENDENT_AMBULATORY_CARE_PROVIDER_SITE_OTHER): Payer: Medicaid Other | Admitting: Obstetrics and Gynecology

## 2020-04-22 ENCOUNTER — Encounter: Payer: Self-pay | Admitting: Obstetrics and Gynecology

## 2020-04-22 ENCOUNTER — Other Ambulatory Visit: Payer: Self-pay

## 2020-04-22 VITALS — BP 128/80 | Ht 61.0 in | Wt 161.2 lb

## 2020-04-22 DIAGNOSIS — N920 Excessive and frequent menstruation with regular cycle: Secondary | ICD-10-CM

## 2020-04-22 DIAGNOSIS — Z113 Encounter for screening for infections with a predominantly sexual mode of transmission: Secondary | ICD-10-CM | POA: Diagnosis not present

## 2020-04-22 DIAGNOSIS — R875 Abnormal microbiological findings in specimens from female genital organs: Secondary | ICD-10-CM | POA: Diagnosis not present

## 2020-04-22 NOTE — Progress Notes (Signed)
Patient ID: Joyce Swanson, female   DOB: 1990-07-22, 30 y.o.   MRN: [id number]  Reason for Consult: Gynecologic Exam   Referred by Doreen Beam, F*  Subjective:     HPI:  Joyce Swanson is a 30 y.o. female. She is following up today regarding menorrhagia and dysmenorrhea.  Patient reports that she has been having heavy bleeding during her menstrual period.  She reports that she sometimes passes large clots the size of her hand.  She reports that she has sensations of gushing and flooding of blood.  She reports that she has to change a pad more frequently than every hour.  She reports that she has accidents where she bleeds through her clothing.  She reports that she does have severe pain that has limited her activity tremendously during her menstrual cycle.  Patient reports that her menstrual pain and bleeding has worsened her anxiety problems.  She reports that she had a lot of complications with her last pregnancy and is very concerned regarding the even remote possibility of becoming pregnant.  Sometimes she will take a pregnancy test even though she has had a prior tubal ligation.  Fortunately these pregnancy test have been negative.  She does feel like she is done having children's does not desire pregnancy again in the future.  Patient also reports that she has been suffering in the last year with severe anxiety depression PTSD ADHD and OCD.  She is currently working with a psychiatrist to manage the symptoms and has been trying different medications.  She voices fortunately that she has a good support network of friends.  PHQ-9 24 GAD-7 21  Gynecological History Menarche: 12 LMP: 04/13/2019 Describes periods as monthely every 30-37 days.  Last pap smear: 08/03/2019 NIL History of STDs: no Sexually Active: yes with men and women. Some pain with intercourse.  Obstetrical History  OB History  Gravida Para Term Preterm AB Living  3 3 2 1   3   SAB IAB Ectopic Multiple Live  Births          3    # Outcome Date GA Lbr Len/2nd Weight Sex Delivery Anes PTL Lv  3 Preterm 06/26/13    M CS-Unspec   LIV  2 Term 02/04/12    M CS-Unspec   LIV  1 Term 03/05/09    Joyce Swanson   LIV     Past Medical History:  Diagnosis Date  . Anemia   . Anxiety   . Asthma   . Depression   . Fibroid, uterine   . Liver mass   . Sleep apnea    Family History  Problem Relation Age of Onset  . Diabetes Father   . Diabetes Paternal Aunt   . Diabetes Paternal Uncle   . Diabetes Paternal Grandmother    History reviewed. No pertinent surgical history.  Short Social History:  Social History   Tobacco Use  . Smoking status: Current Every Day Smoker  . Smokeless tobacco: Never Used  Substance Use Topics  . Alcohol use: Yes    No Known Allergies  No current outpatient medications on file.   No current facility-administered medications for this visit.    Review of Systems  Constitutional: Positive for fatigue. Negative for chills, fever and unexpected weight change.  HENT: Negative for trouble swallowing.  Eyes: Negative for loss of vision.  Respiratory: Negative for cough, shortness of breath and wheezing.  Cardiovascular: Positive for chest pain. Negative for leg swelling, palpitations and syncope.  GI: Positive for diarrhea. Negative for abdominal pain, blood in stool, nausea and vomiting.  GU: Positive for dysuria. Negative for difficulty urinating, frequency and hematuria.  Musculoskeletal: Positive for joint pain and myalgias. Negative for back pain.  Skin: Negative for rash.  Neurological: Positive for headaches and numbness. Negative for dizziness, light-headedness and seizures.  Psychiatric: Positive for depressed mood. Negative for behavioral problem, confusion and sleep disturbance.        Objective:  Objective   Vitals:   04/22/20 1503  BP: 128/80  Weight: 161 lb 3.2 oz (73.1 kg)  Height: 5\' 1"  (1.549 m)   Body mass index is 30.46  kg/m.  Physical Exam Vitals and nursing note reviewed. Exam conducted with a chaperone present.  Constitutional:      Appearance: She is well-developed and well-nourished.  HENT:     Head: Normocephalic and atraumatic.  Eyes:     Extraocular Movements: EOM normal.     Pupils: Pupils are equal, round, and reactive to light.  Cardiovascular:     Rate and Rhythm: Normal rate and regular rhythm.  Pulmonary:     Effort: Pulmonary effort is normal. No respiratory distress.  Abdominal:     General: Abdomen is flat.     Palpations: Abdomen is soft.  Genitourinary:    Comments: External: Normal appearing vulva. No lesions noted.  Speculum examination: Normal appearing cervix. No blood in the vaginal vault. No discharge.   Bimanual examination: Uterus midline, tender, slight enlargement. Normal contour.  No CMT. No adnexal masses. No adnexal tenderness. Pelvis not fixed.  Skin:    General: Skin is warm and dry.  Neurological:     Mental Status: She is alert and oriented to person, place, and time.  Psychiatric:        Mood and Affect: Mood and affect normal.        Behavior: Behavior normal.        Thought Content: Thought content normal.        Judgment: Judgment normal.     Assessment/Plan:     30 year old with severe dysmenorrhea and menorrhagia. Prior endometrial ultrasound had shown a slightly enlarged uterus but no discrete fibroids.  Given patient's concerns of heavy bleeding pain and painful periods and slight enlargement of her uterus there is a possibility of adenomyosis. Unfortunately adenomyosis is difficult to diagnose and is only confirmed by a pathologist after a hysterectomy.  We reviewed options for medical management of heavy bleeding and menorrhagia including oral contraceptive pills,  IUD, and Nexplanon. We discussed minimally invasive options such as an endometrial ablation. She is most interested in a hysterectomy. She will follow up to discuss this further. Will  check CBC and TSH today.   STD testing desired by patient swab and blood work today.   More than 30 minutes were spent face to face with the patient in the room, reviewing the medical record, labs and images, and coordinating care for the patient. The plan of management was discussed in detail and counseling was provided.       Adrian Prows MD [facility] 04/22/2020 3:48 PM

## 2020-04-22 NOTE — Patient Instructions (Signed)
Hysterectomy Information °A hysterectomy is a surgery in which the uterus is removed. The lowest part of the uterus (cervix), which opens into the vagina, may be removed as well. In some cases, the fallopian tubes, the ovaries,  or both the fallopian tubes and the ovaries may also be removed. °This procedure may be done to treat different medical problems. It may also be done to help transgender men feel more masculine. After the procedure, a woman will no longer have menstrual periods and will not be able to become pregnant (sterile). °What are the reasons for a hysterectomy? °There are many reasons why a person might have this procedure. They include: °Persistent, abnormal vaginal bleeding. °Long-term (chronic) pelvic pain or infection. °Endometriosis. This is when the lining of the uterus (endometrium) starts to grow outside the uterus. °Adenomyosis. This is when the endometrium starts to grow in the muscle of the uterus. °Pelvic organ prolapse. This is a condition in which the uterus falls down into the vagina. °Noncancerous growths in the uterus (uterine fibroids) that cause symptoms. °The presence of precancerous cells. °Cervical or uterine cancer. °Sex change. This helps a transgender man complete his female identity. °What are the different types of hysterectomy? °There are three different types of hysterectomy: °Supracervical hysterectomy. In this type, the top part of the uterus is removed, but not the cervix. °Total hysterectomy. In this type, the uterus and cervix are removed. °Radical hysterectomy. In this type, the uterus, the cervix, and the tissue that holds the uterus in place (parametrium) are removed. °What are the different ways a hysterectomy can be performed? °There are many different ways a hysterectomy can be performed, including: °Abdominal hysterectomy. In this type, an incision is made in the abdomen. The uterus is removed through this incision. °Vaginal hysterectomy. In this type, an  incision is made in the vagina. The uterus is removed through this incision. There are no abdominal incisions. °Conventional laparoscopic hysterectomy. In this type, 3 or 4 small incisions are made in the abdomen. A thin, lighted tube with a camera (laparoscope) is inserted into one of the incisions. Other tools are put through the other incisions. The uterus is cut into small pieces. The small pieces are removed through the incisions or the vagina. °Laparoscopically assisted vaginal hysterectomy (LAVH). In this type, 3 or 4 small incisions are made in the abdomen. Part of the surgery is performed laparoscopically and the other part is done vaginally. The uterus is removed through the vagina. °Robot-assisted laparoscopic hysterectomy. In this type, a laparoscope and other tools are inserted into 3 or 4 small incisions in the abdomen. A computer-controlled device is used to give the surgeon a 3D image and to help control the surgical instruments. This allows for more precise movements of surgical instruments. The uterus is cut into small pieces and removed through the incisions or the vagina. °Discuss the options with your health care provider to determine which type is the right one for you. °What are the risks of this surgery? °Generally, this is a safe procedure. However, problems may occur, including: °Bleeding and risk of blood transfusion. Tell your health care provider if you do not want to receive any blood products. °Blood clots in the legs or lung. °Infection. °Damage to nearby structures or organs. °Allergic reactions to medicines. °Having to change to an abdominal hysterectomy after starting a less invasive technique. °What to expect after a hysterectomy °You will be given pain medicine. °You may need to stay in the hospital for 1-2 days   to recover, depending on the type of hysterectomy you had. °You will need to have someone with you for the first 3-5 days after you go home. °You will need to follow up  with your surgeon in 2-4 weeks after surgery to evaluate your progress. °If the ovaries are removed, you will have early menopause symptoms such as hot flashes, night sweats, and insomnia. °If you had a hysterectomy for a problem that was not cancer or a condition that could not lead to cancer, then you no longer need Pap tests. However, even if you no longer need a Pap test, get regular pelvic exams to make sure no other problems are developing. °Questions to ask your health care provider °Is a hysterectomy medically necessary? Do I have other treatment options for my condition? °What are my options for hysterectomy procedure? °What organs and tissues need to be removed? °What are the risks? °What are the benefits? °How long will I need to stay in the hospital after the procedure? °How long will I need to recover at home? °What symptoms can I expect after the procedure? °Summary °A hysterectomy is a surgery in which the uterus is removed. The fallopian tubes, the ovaries, or both may be removed as well. °This procedure may be done to treat different medical problems. It may also be done to complete your female identity during a sex change. °After the procedure, a woman will no longer have a menstrual period and will not be able to become pregnant. °There are three types of hysterectomy. Discuss with your health care provider which options are right for you. °This is a safe procedure, though there are some risks. Risks include infection, bleeding, blood clots, or damage to nearby organs. °This information is not intended to replace advice given to you by your health care provider. Make sure you discuss any questions you have with your health care provider. °Document Revised: 12/15/2018 Document Reviewed: 12/15/2018 °Elsevier Patient Education © 2021 [employer] ° °

## 2020-04-23 LAB — CBC
Hematocrit: 39.1 % (ref 34.0–46.6)
Hemoglobin: 13.5 g/dL (ref 11.1–15.9)
MCH: 30.6 pg (ref 26.6–33.0)
MCHC: 34.5 g/dL (ref 31.5–35.7)
MCV: 89 fL (ref 79–97)
Platelets: 376 10*3/uL (ref 150–450)
RBC: 4.41 x10E6/uL (ref 3.77–5.28)
RDW: 13.8 % (ref 11.7–15.4)
WBC: 8.5 10*3/uL (ref 3.4–10.8)

## 2020-04-23 LAB — HEPATITIS PANEL, ACUTE
Hep A IgM: NEGATIVE
Hep B C IgM: NEGATIVE
Hep C Virus Ab: <0.1 s/co ratio (ref 0.0–0.9)
Hepatitis B Surface Ag: NEGATIVE

## 2020-04-23 LAB — TSH: TSH: 1.05 u[IU]/mL (ref 0.450–4.500)

## 2020-04-23 LAB — HIV ANTIBODY (ROUTINE TESTING W REFLEX): HIV Screen 4th Generation wRfx: NONREACTIVE

## 2020-04-23 LAB — RPR: RPR Ser Ql: NONREACTIVE

## 2020-04-26 LAB — NUSWAB VAGINITIS PLUS (VG+)
Candida albicans, NAA: NEGATIVE
Candida glabrata, NAA: NEGATIVE
Chlamydia trachomatis, NAA: NEGATIVE
Neisseria gonorrhoeae, NAA: NEGATIVE
Trich vag by NAA: NEGATIVE

## 2020-04-29 DIAGNOSIS — J40 Bronchitis, not specified as acute or chronic: Secondary | ICD-10-CM

## 2020-04-29 HISTORY — DX: Bronchitis, not specified as acute or chronic: J40

## 2020-05-01 DIAGNOSIS — K200 Eosinophilic esophagitis: Secondary | ICD-10-CM | POA: Diagnosis not present

## 2020-05-01 DIAGNOSIS — K2100 Gastro-esophageal reflux disease with esophagitis, without bleeding: Secondary | ICD-10-CM | POA: Diagnosis not present

## 2020-05-05 DIAGNOSIS — F4312 Post-traumatic stress disorder, chronic: Secondary | ICD-10-CM | POA: Diagnosis not present

## 2020-05-05 DIAGNOSIS — F332 Major depressive disorder, recurrent severe without psychotic features: Secondary | ICD-10-CM | POA: Diagnosis not present

## 2020-05-05 DIAGNOSIS — F411 Generalized anxiety disorder: Secondary | ICD-10-CM | POA: Diagnosis not present

## 2020-05-05 DIAGNOSIS — F410 Panic disorder [episodic paroxysmal anxiety] without agoraphobia: Secondary | ICD-10-CM | POA: Diagnosis not present

## 2020-05-05 DIAGNOSIS — F900 Attention-deficit hyperactivity disorder, predominantly inattentive type: Secondary | ICD-10-CM | POA: Diagnosis not present

## 2020-05-06 ENCOUNTER — Ambulatory Visit (INDEPENDENT_AMBULATORY_CARE_PROVIDER_SITE_OTHER): Payer: Medicaid Other | Admitting: Obstetrics and Gynecology

## 2020-05-06 ENCOUNTER — Other Ambulatory Visit: Payer: Self-pay

## 2020-05-06 ENCOUNTER — Encounter: Payer: Self-pay | Admitting: Obstetrics and Gynecology

## 2020-05-06 VITALS — BP 132/80 | Ht 61.0 in | Wt 161.2 lb

## 2020-05-06 DIAGNOSIS — N946 Dysmenorrhea, unspecified: Secondary | ICD-10-CM | POA: Diagnosis not present

## 2020-05-06 DIAGNOSIS — N920 Excessive and frequent menstruation with regular cycle: Secondary | ICD-10-CM

## 2020-05-06 DIAGNOSIS — N939 Abnormal uterine and vaginal bleeding, unspecified: Secondary | ICD-10-CM

## 2020-05-06 NOTE — Progress Notes (Signed)
Patient ID: Joyce Swanson, female   DOB: 1990-10-05, 30 y.o.   MRN: [id number]  Reason for Consult: Gynecologic Exam   Referred by Doreen Beam, F*  Subjective:     HPI:  Joyce Swanson is a 30 y.o. female.  Joyce Swanson presents today for consultation regarding menorrhagia and dysmenorrhea.  Patient has been contemplating medical versus surgical management of these issues.  She had decided strongly that she wanted to have a hysterectomy however her family members voiced some concern regarding possibilities that she could enter an early menopause or have extreme weight gain after hysterectomy.  The patient also found out that a boyfriend whom she is seeing online does desire to have children and she is concerned how that may affect their relationship moving forward. She has expressed to me before that she does not desire more children.  We had a long conversation in the office.  We discussed alternative options to hysterectomy such as Nexplanon or an IUD.  The patient has taken Depo-Provera in the past and reports that she did not like the side effects of weight gain that she experience with this medication.  She has had a prior tubal ligation and questions if she could get pregnant again.  We discussed that she could have a tubal ligation reversal surgery although these can carry risk for ectopic pregnancies.  Having a tubal ligation reversal does not necessarily guarantee that it would be successful.  We also discussed that she could have an IVF procedure and be able to carry a pregnancy in the future.  The patient then said that her sister had offered to carry any future pregnancies if they would be desired between her and her boyfriend.  She reports that her sister does not desire children of her own but would be okay with carrying a pregnancy for the patient if needed.   Ultimately the patient decided that her pain and heavy bleeding are very severe and that she would like to have a  hysterectomy.  She reports that it is painful for her children to even lay on her abdomen because her uterus can be tender and cause severe cramps.  She feels like a hysterectomy would be best suited for her.  She reports that her symptoms of menorrhagia have been contributing significantly to her depression anxiety.  She is having ongoing follow-up with her psychiatrist regarding this and recently started on Cymbalta.   Past Medical History:  Diagnosis Date  . Anemia   . Anxiety   . Asthma   . Depression   . Fibroid, uterine   . Liver mass   . Sleep apnea    Family History  Problem Relation Age of Onset  . Diabetes Father   . Diabetes Paternal Aunt   . Diabetes Paternal Uncle   . Diabetes Paternal Grandmother    Past Surgical History:  Procedure Laterality Date  . CESAREAN SECTION      Short Social History:  Social History   Tobacco Use  . Smoking status: Current Every Day Smoker  . Smokeless tobacco: Never Used  Substance Use Topics  . Alcohol use: Yes    Allergies  Allergen Reactions  . Prazosin Palpitations    Current Outpatient Medications  Medication Sig Dispense Refill  . DULoxetine (CYMBALTA) 20 MG capsule Take 20 mg by mouth daily.     No current facility-administered medications for this visit.    Review of Systems  Constitutional: Negative for chills, fatigue, fever and unexpected weight change.  HENT: Negative for trouble swallowing.  Eyes: Negative for loss of vision.  Respiratory: Negative for cough, shortness of breath and wheezing.  Cardiovascular: Negative for chest pain, leg swelling, palpitations and syncope.  GI: Negative for abdominal pain, blood in stool, diarrhea, nausea and vomiting.  GU: Negative for difficulty urinating, dysuria, frequency and hematuria.  Musculoskeletal: Negative for back pain, leg pain and joint pain.  Skin: Negative for rash.  Neurological: Negative for dizziness, headaches, light-headedness, numbness and  seizures.  Psychiatric: Negative for behavioral problem, confusion, depressed mood and sleep disturbance.        Objective:  Objective   Vitals:   05/06/20 1459  BP: 132/80  Weight: 161 lb 3.2 oz (73.1 kg)  Height: 5\' 1"  (1.549 m)   Body mass index is 30.46 kg/m.  Physical Exam Vitals and nursing note reviewed. Exam conducted with a chaperone present.  Constitutional:      Appearance: Normal appearance.  HENT:     Head: Normocephalic and atraumatic.  Eyes:     Extraocular Movements: Extraocular movements intact.     Pupils: Pupils are equal, round, and reactive to light.  Cardiovascular:     Rate and Rhythm: Normal rate and regular rhythm.  Pulmonary:     Effort: Pulmonary effort is normal.     Breath sounds: Normal breath sounds.  Abdominal:     General: Abdomen is flat.     Palpations: Abdomen is soft.  Musculoskeletal:     Cervical back: Normal range of motion.  Skin:    General: Skin is warm and dry.  Neurological:     General: No focal deficit present.     Mental Status: She is alert and oriented to person, place, and time.  Psychiatric:        Behavior: Behavior normal.        Thought Content: Thought content normal.        Judgment: Judgment normal.     Assessment/Plan:     30 yo with dysmenorrhea and menorrhagia Patient desires a hysterectomy.  We spoke at length regarding alternative options to hysterectomy including medical therapy such as a Nexplanon and IUD as well as options such as an ablation for minimally invasive surgery.  At the end of the visit the patient stated that she strongly prefers to have a hysterectomy.  For this reason I sent a note to our surgical scheduler to follow-up with the patient and schedule her procedure.  Discussed with patient that if she decides at any point to change her mind regarding the hysterectomy than that that is acceptable and allowable.  We discussed possible complications of surgery such as risk of infection risk  of bleeding risk of damage to surrounding abdominal structures.  We discussed anticipated recovery of 2 to 6 weeks of limited activity as well as 12 weeks with weight lifting limitations and refraining from intercourse and tampon usage.  More than 30 minutes were spent face to face with the patient in the room, reviewing the medical record, labs and images, and coordinating care for the patient. The plan of management was discussed in detail and counseling was provided.    Adrian Prows MD [facility] 05/06/2020 3:47 PM

## 2020-05-07 DIAGNOSIS — H5213 Myopia, bilateral: Secondary | ICD-10-CM | POA: Diagnosis not present

## 2020-05-12 ENCOUNTER — Telehealth: Payer: Self-pay

## 2020-05-13 NOTE — Telephone Encounter (Signed)
Left a message for the patient to return the call.  

## 2020-05-16 ENCOUNTER — Ambulatory Visit: Payer: Self-pay | Admitting: Adult Health

## 2020-05-21 ENCOUNTER — Ambulatory Visit (INDEPENDENT_AMBULATORY_CARE_PROVIDER_SITE_OTHER): Payer: Self-pay | Admitting: Adult Health

## 2020-05-21 DIAGNOSIS — Z5329 Procedure and treatment not carried out because of patient's decision for other reasons: Secondary | ICD-10-CM

## 2020-05-21 NOTE — Progress Notes (Signed)
      Established patient visit   Patient: Joyce Swanson   DOB: 23-Mar-1990   30 y.o. Female  MRN: [id number] Visit Date: 05/21/2020 NO show for visit.

## 2020-06-02 NOTE — Telephone Encounter (Signed)
Left a message for the patient to return the call.  

## 2020-06-09 DIAGNOSIS — F900 Attention-deficit hyperactivity disorder, predominantly inattentive type: Secondary | ICD-10-CM | POA: Diagnosis not present

## 2020-06-09 DIAGNOSIS — F4312 Post-traumatic stress disorder, chronic: Secondary | ICD-10-CM | POA: Diagnosis not present

## 2020-06-09 DIAGNOSIS — F410 Panic disorder [episodic paroxysmal anxiety] without agoraphobia: Secondary | ICD-10-CM | POA: Diagnosis not present

## 2020-06-09 DIAGNOSIS — F332 Major depressive disorder, recurrent severe without psychotic features: Secondary | ICD-10-CM | POA: Diagnosis not present

## 2020-06-09 DIAGNOSIS — F411 Generalized anxiety disorder: Secondary | ICD-10-CM | POA: Diagnosis not present

## 2020-06-09 NOTE — Telephone Encounter (Signed)
Patient rtn'd call to schedule xi robot assisted laparoscopic total hysterectomy, bilateral salpingectomy, cystoscopy w Schuman  DOS 4/26  H&P 4/19 @ 11:30   Covid testing 4/22 @ [facility], [address]. Advised pt to quarantine until DOS.  Pre-admit phone call appointment to be requested - date and time will be included on H&P paper work. Also all appointments will be updated on pt MyChart. Explained that this appointment has a call window. Based on the time scheduled will indicate if the call will be received within a 4 hour window before 1:00 or after.  Advised that pt may also receive calls from the hospital pharmacy and pre-service center.  Confirmed pt has [employer] as primary insurance. No secondary insurance.  Tubal ligation was done 06/26/13 Will include HYST consent for H&P

## 2020-06-10 NOTE — Telephone Encounter (Signed)
It will. I am having to do IQ. Time will release today at 5:00 and you will then see it  [facility]

## 2020-06-10 NOTE — Telephone Encounter (Signed)
For some reason this isn't popping up on my schedule that day.

## 2020-06-12 NOTE — Telephone Encounter (Signed)
Orders placed. Thank you for scheduling

## 2020-06-17 ENCOUNTER — Encounter: Payer: Self-pay | Admitting: Obstetrics and Gynecology

## 2020-06-17 ENCOUNTER — Other Ambulatory Visit: Payer: Self-pay

## 2020-06-17 ENCOUNTER — Other Ambulatory Visit
Admission: RE | Admit: 2020-06-17 | Discharge: 2020-06-17 | Disposition: A | Discharge status: Home / Self Care | Payer: Medicaid Other | Source: Ambulatory Visit | Attending: Obstetrics and Gynecology | Admitting: Obstetrics and Gynecology

## 2020-06-17 ENCOUNTER — Ambulatory Visit (INDEPENDENT_AMBULATORY_CARE_PROVIDER_SITE_OTHER): Payer: Medicaid Other | Admitting: Obstetrics and Gynecology

## 2020-06-17 VITALS — BP 110/70 | Ht 61.0 in | Wt 162.8 lb

## 2020-06-17 DIAGNOSIS — N939 Abnormal uterine and vaginal bleeding, unspecified: Secondary | ICD-10-CM

## 2020-06-17 DIAGNOSIS — N946 Dysmenorrhea, unspecified: Secondary | ICD-10-CM

## 2020-06-17 DIAGNOSIS — N920 Excessive and frequent menstruation with regular cycle: Secondary | ICD-10-CM | POA: Diagnosis not present

## 2020-06-17 HISTORY — DX: Gestational diabetes mellitus in pregnancy, unspecified control: O24.419

## 2020-06-17 NOTE — Patient Instructions (Addendum)
Your procedure is scheduled on:  Tuesday, April 26 Report to the Registration Desk on the 1st floor of the [facility]. To find out your arrival time, please call [phone] between 1PM - 3PM on: Monday, April 25  REMEMBER: Instructions that are not followed completely may result in serious medical risk, up to and including death; or upon the discretion of your surgeon and anesthesiologist your surgery may need to be rescheduled.  Do not eat food after midnight the night before surgery.  No gum chewing, lozengers or hard candies.  You may however, drink CLEAR liquids up to 2 hours before you are scheduled to arrive for your surgery. Do not drink anything within 2 hours of your scheduled arrival time.  Clear liquids include: - water  - apple juice without pulp - gatorade (not RED, PURPLE, OR BLUE) - black coffee or tea (Do NOT add milk or creamers to the coffee or tea) Do NOT drink anything that is not on this list.  In addition, your doctor has ordered for you to drink the provided  Ensure Pre-Surgery Clear Carbohydrate Drink  Drinking this carbohydrate drink up to two hours before surgery helps to reduce insulin resistance and improve patient outcomes. Please complete drinking 2 hours prior to scheduled arrival time.  TAKE THESE MEDICATIONS THE MORNING OF SURGERY WITH A SIP OF WATER:  1.  Albuterol inhaler 2.  Duloxetine (Cymbalta) 3.  Pantoprazole (Protonix) - (take one the night before and one on the morning of surgery - helps to prevent nausea after surgery.)  Use inhalers on the day of surgery and bring to the hospital.  One week prior to surgery: starting April 19 Stop Anti-inflammatories (NSAIDS) such as Advil, Aleve, Ibuprofen, Motrin, Naproxen, Naprosyn and Aspirin based products such as Excedrin, Goodys Powder, BC Powder. Stop ANY OVER THE COUNTER supplements until after surgery.  No Alcohol for 24 hours before or after surgery.  No Smoking including e-cigarettes  for 24 hours prior to surgery.  No chewable tobacco products for at least 6 hours prior to surgery.  No nicotine patches on the day of surgery.  Do not use any "recreational" drugs for at least a week prior to your surgery.  Please be advised that the combination of cocaine and anesthesia may have negative outcomes, up to and including death. If you test positive for cocaine, your surgery will be cancelled.  On the morning of surgery brush your teeth with toothpaste and water, you may rinse your mouth with mouthwash if you wish. Do not swallow any toothpaste or mouthwash.  Do not wear jewelry, make-up, hairpins, clips or nail polish.  Do not wear lotions, powders, or perfumes.   Do not shave body from the neck down 48 hours prior to surgery just in case you cut yourself which could leave a site for infection.  Also, freshly shaved skin may become irritated if using the CHG soap.  Contact lenses, hearing aids and dentures may not be worn into surgery.  Do not bring valuables to the hospital. [facility] is not responsible for any missing/lost belongings or valuables.   Use CHG Soap as directed on instruction sheet.  Notify your doctor if there is any change in your medical condition (cold, fever, infection).  Wear comfortable clothing (specific to your surgery type) to the hospital.  Plan for stool softeners for home use; pain medications have a tendency to cause constipation. You can also help prevent constipation by eating foods high in fiber such as  fruits and vegetables and drinking plenty of fluids as your diet allows.  After surgery, you can help prevent lung complications by doing breathing exercises.  Take deep breaths and cough every 1-2 hours. Your doctor may order a device called an Incentive Spirometer to help you take deep breaths. When coughing or sneezing, hold a pillow firmly against your incision with both hands. This is called "splinting." Doing this helps protect  your incision. It also decreases belly discomfort.  If you are being discharged the day of surgery, you will not be allowed to drive home. You will need a responsible adult (18 years or older) to drive you home and stay with you that night.   If you are taking public transportation, you will need to have a responsible adult (18 years or older) with you. Please confirm with your physician that it is acceptable to use public transportation.   Please call the [facility] at [phone] if you have any questions about these instructions.  Surgery Visitation Policy:  Patients undergoing a surgery or procedure may have one family member or support person with them as long as that person is not COVID-19 positive or experiencing its symptoms.  That person may remain in the waiting area during the procedure.

## 2020-06-17 NOTE — H&P (View-Only) (Signed)
Patient ID: Joyce Swanson, female   DOB: [birth year], 30 y.o.   MRN: [id number]  Reason for Consult: Gynecologic Exam   Referred by Doreen Beam, F*  Subjective:     HPI:  Joyce Swanson is a 30 y.o. female. She has been having issues with ongoing dysmenorrhea and menorrhagia. She has been counseled regarding her options for management extensively. She has previously had a tubal ligation. She desires definitve therapy with an hysterectomy and is here for a preoperative visit.  Please see prior notes which describe her bleeding.    Past Medical History:  Diagnosis Date  . Anemia   . Anxiety   . Asthma   . Depression   . Fibroid, uterine   . Liver mass   . Sleep apnea    Family History  Problem Relation Age of Onset  . Diabetes Father   . Diabetes Paternal Aunt   . Diabetes Paternal Uncle   . Diabetes Paternal Grandmother    Past Surgical History:  Procedure Laterality Date  . CESAREAN SECTION      Short Social History:  Social History   Tobacco Use  . Smoking status: Current Every Day Smoker  . Smokeless tobacco: Never Used  Substance Use Topics  . Alcohol use: Yes    Allergies  Allergen Reactions  . Prazosin Palpitations    Current Outpatient Medications  Medication Sig Dispense Refill  . albuterol (VENTOLIN HFA) 108 (90 Base) MCG/ACT inhaler Inhale 1-2 puffs into the lungs every 6 (six) hours as needed for wheezing or shortness of breath.    . DULoxetine (CYMBALTA) 20 MG capsule Take 20 mg by mouth daily.    . pantoprazole (PROTONIX) 40 MG tablet Take 40 mg by mouth 2 (two) times daily.    . propranolol (INDERAL) 10 MG tablet Take 10 mg by mouth every 6 (six) hours as needed for anxiety.     No current facility-administered medications for this visit.    Review of Systems  Constitutional: Negative for chills, fatigue, fever and unexpected weight change.  HENT: Negative for trouble swallowing.  Eyes: Negative for loss of vision.  Respiratory:  Negative for cough, shortness of breath and wheezing.  Cardiovascular: Negative for chest pain, leg swelling, palpitations and syncope.  GI: Negative for abdominal pain, blood in stool, diarrhea, nausea and vomiting.  GU: Negative for difficulty urinating, dysuria, frequency and hematuria.  Musculoskeletal: Negative for back pain, leg pain and joint pain.  Skin: Negative for rash.  Neurological: Negative for dizziness, headaches, light-headedness, numbness and seizures.  Psychiatric: Negative for behavioral problem, confusion, depressed mood and sleep disturbance.        Objective:  Objective   Vitals:   06/17/20 1202  BP: 110/70  Weight: 162 lb 12.8 oz (73.8 kg)  Height: 5\' 1"  (1.549 m)   Body mass index is 30.76 kg/m.  Physical Exam Vitals and nursing note reviewed. Exam conducted with a chaperone present.  Constitutional:      Appearance: Normal appearance.  HENT:     Head: Normocephalic and atraumatic.  Eyes:     Extraocular Movements: Extraocular movements intact.     Pupils: Pupils are equal, round, and reactive to light.  Cardiovascular:     Rate and Rhythm: Normal rate and regular rhythm.  Pulmonary:     Effort: Pulmonary effort is normal.     Breath sounds: Normal breath sounds.  Abdominal:     General: Abdomen is flat.     Palpations: Abdomen is soft.  Musculoskeletal:     Cervical back: Normal range of motion.  Skin:    General: Skin is warm and dry.  Neurological:     General: No focal deficit present.     Mental Status: She is alert and oriented to person, place, and time.  Psychiatric:        Behavior: Behavior normal.        Thought Content: Thought content normal.        Judgment: Judgment normal.     Assessment/Plan:     30 yo with dysmenorrhea and menorrhagia.  I have had a careful discussion with this patient about all the options available and the risk/benefits of each. I have fully informed this patient that a hysterectomy may subject her  to a variety of discomforts and risks: She understands that most patients have surgery with little difficulty, but problems can happen ranging from minor to fatal. These include nausea, vomiting, pain, bleeding, infection, poor healing, hernia, wound separation, vaginal cuff separation or formation of adhesions. Unexpected reactions may occur from any drug or anesthetic given. Unintended injury may occur to other pelvic or abdominal structures such as Fallopian tubes, ovaries, bladder, ureter (tube from kidney to bladder), or bowel. Nerves going from the pelvis to the legs may be injured. Any such injury may require immediate or later additional surgery to correct the problem. Excessive blood loss requiring transfusion is very unlikely but possible. Dangerous blood clots may form in the legs or lungs. Physical and sexual activity will be restricted in varying degrees for an indeterminate period of time but most often 2-4 weeks. She understands that the plan is to do this laparoscopically, however, there is a chance that this will need to be performed via a larger incision. She may be hospitalized overnight. Finally, she understands that it is impossible to list every possible undesirable effect and that the condition for which surgery is done is not always cured or significantly improved, and in rare cases may be even worse. I have also counseled her extensively about the pros and cons of ovarian conservation versus removal. Ample time was given to answer all questions.  More than 20 minutes were spent face to face with the patient in the room, reviewing the medical record, labs and images, and coordinating care for the patient. The plan of management was discussed in detail and counseling was provided.     Adrian Prows MD [facility], [facility] 06/17/2020 1:04 PM

## 2020-06-17 NOTE — Progress Notes (Signed)
Patient ID: Joyce Swanson, female   DOB: 02/10/1991, 30 y.o.   MRN: [id number]  Reason for Consult: Gynecologic Exam   Referred by Doreen Beam, F*  Subjective:     HPI:  Joyce Swanson is a 30 y.o. female. She has been having issues with ongoing dysmenorrhea and menorrhagia. She has been counseled regarding her options for management extensively. She has previously had a tubal ligation. She desires definitve therapy with an hysterectomy and is here for a preoperative visit.  Please see prior notes which describe her bleeding.    Past Medical History:  Diagnosis Date  . Anemia   . Anxiety   . Asthma   . Depression   . Fibroid, uterine   . Liver mass   . Sleep apnea    Family History  Problem Relation Age of Onset  . Diabetes Father   . Diabetes Paternal Aunt   . Diabetes Paternal Uncle   . Diabetes Paternal Grandmother    Past Surgical History:  Procedure Laterality Date  . CESAREAN SECTION      Short Social History:  Social History   Tobacco Use  . Smoking status: Current Every Day Smoker  . Smokeless tobacco: Never Used  Substance Use Topics  . Alcohol use: Yes    Allergies  Allergen Reactions  . Prazosin Palpitations    Current Outpatient Medications  Medication Sig Dispense Refill  . albuterol (VENTOLIN HFA) 108 (90 Base) MCG/ACT inhaler Inhale 1-2 puffs into the lungs every 6 (six) hours as needed for wheezing or shortness of breath.    . DULoxetine (CYMBALTA) 20 MG capsule Take 20 mg by mouth daily.    . pantoprazole (PROTONIX) 40 MG tablet Take 40 mg by mouth 2 (two) times daily.    . propranolol (INDERAL) 10 MG tablet Take 10 mg by mouth every 6 (six) hours as needed for anxiety.     No current facility-administered medications for this visit.    Review of Systems  Constitutional: Negative for chills, fatigue, fever and unexpected weight change.  HENT: Negative for trouble swallowing.  Eyes: Negative for loss of vision.  Respiratory:  Negative for cough, shortness of breath and wheezing.  Cardiovascular: Negative for chest pain, leg swelling, palpitations and syncope.  GI: Negative for abdominal pain, blood in stool, diarrhea, nausea and vomiting.  GU: Negative for difficulty urinating, dysuria, frequency and hematuria.  Musculoskeletal: Negative for back pain, leg pain and joint pain.  Skin: Negative for rash.  Neurological: Negative for dizziness, headaches, light-headedness, numbness and seizures.  Psychiatric: Negative for behavioral problem, confusion, depressed mood and sleep disturbance.        Objective:  Objective   Vitals:   06/17/20 1202  BP: 110/70  Weight: 162 lb 12.8 oz (73.8 kg)  Height: 5\' 1"  (1.549 m)   Body mass index is 30.76 kg/m.  Physical Exam Vitals and nursing note reviewed. Exam conducted with a chaperone present.  Constitutional:      Appearance: Normal appearance.  HENT:     Head: Normocephalic and atraumatic.  Eyes:     Extraocular Movements: Extraocular movements intact.     Pupils: Pupils are equal, round, and reactive to light.  Cardiovascular:     Rate and Rhythm: Normal rate and regular rhythm.  Pulmonary:     Effort: Pulmonary effort is normal.     Breath sounds: Normal breath sounds.  Abdominal:     General: Abdomen is flat.     Palpations: Abdomen is soft.  Musculoskeletal:     Cervical back: Normal range of motion.  Skin:    General: Skin is warm and dry.  Neurological:     General: No focal deficit present.     Mental Status: She is alert and oriented to person, place, and time.  Psychiatric:        Behavior: Behavior normal.        Thought Content: Thought content normal.        Judgment: Judgment normal.     Assessment/Plan:     30 yo with dysmenorrhea and menorrhagia.  I have had a careful discussion with this patient about all the options available and the risk/benefits of each. I have fully informed this patient that a hysterectomy may subject her  to a variety of discomforts and risks: She understands that most patients have surgery with little difficulty, but problems can happen ranging from minor to fatal. These include nausea, vomiting, pain, bleeding, infection, poor healing, hernia, wound separation, vaginal cuff separation or formation of adhesions. Unexpected reactions may occur from any drug or anesthetic given. Unintended injury may occur to other pelvic or abdominal structures such as Fallopian tubes, ovaries, bladder, ureter (tube from kidney to bladder), or bowel. Nerves going from the pelvis to the legs may be injured. Any such injury may require immediate or later additional surgery to correct the problem. Excessive blood loss requiring transfusion is very unlikely but possible. Dangerous blood clots may form in the legs or lungs. Physical and sexual activity will be restricted in varying degrees for an indeterminate period of time but most often 2-4 weeks. She understands that the plan is to do this laparoscopically, however, there is a chance that this will need to be performed via a larger incision. She may be hospitalized overnight. Finally, she understands that it is impossible to list every possible undesirable effect and that the condition for which surgery is done is not always cured or significantly improved, and in rare cases may be even worse. I have also counseled her extensively about the pros and cons of ovarian conservation versus removal. Ample time was given to answer all questions.  More than 20 minutes were spent face to face with the patient in the room, reviewing the medical record, labs and images, and coordinating care for the patient. The plan of management was discussed in detail and counseling was provided.     Adrian Prows MD [facility], [facility] 06/17/2020 1:04 PM

## 2020-06-17 NOTE — Patient Instructions (Signed)
Laparoscopically Assisted Vaginal Hysterectomy, Care After The following information offers guidance on how to care for yourself after your procedure. Your health care provider may also give you more specific instructions. If you have problems or questions, contact your health care provider. What can I expect after the procedure? After the procedure, it is common to have:  Soreness and numbness in your incision areas.  Abdominal pain. You will be given pain medicine to control it.  Vaginal bleeding and discharge. You will need to use a sanitary pad after this procedure.  Tiredness (fatigue).  Poor appetite.  Less interest in sex.  Feelings of sadness or other emotions. If your ovaries were also removed, it is common to have symptoms of menopause, such as hot flashes, night sweats, and lack of sleep (insomnia). Follow these instructions at home: Medicines  Take over-the-counter and prescription medicines only as told by your health care provider.  Do not take aspirin or NSAIDs, such as ibuprofen. These medicines can cause bleeding.  Ask your health care provider if the medicine prescribed to you: ? Requires you to avoid driving or using heavy machinery. ? Can cause constipation. You may need to take these actions to prevent or treat constipation:  Drink enough fluid to keep your urine pale yellow.  Take over-the-counter or prescription medicines.  Eat foods that are high in fiber, such as beans, whole grains, and fresh fruits and vegetables.  Limit foods that are high in fat and processed sugars, such as fried or sweet foods. Incision care  Follow instructions from your health care provider about how to take care of your incisions. Make sure you: ? Wash your hands with soap and water for at least 20 seconds before and after you change your bandage (dressing). If soap and water are not available, use hand sanitizer. ? Change your dressing as told by your health care  provider. ? Leave stitches (sutures), skin glue, or adhesive strips in place. These skin closures may need to stay in place for 2 weeks or longer. If adhesive strip edges start to loosen and curl up, you may trim the loose edges. Do not remove adhesive strips completely unless your health care provider tells you to do that.  Check your incision areas every day for signs of infection. Check for: ? More redness, swelling, or pain. ? Fluid or blood. ? Warmth. ? Pus or a bad smell.   Activity  Rest as told by your healthcare provider.  Return to your normal activities as told by your health care provider. Ask your health care provider what activities are safe for you.  Avoid sitting for a long time without moving. Get up to take short walks every 1-2 hours. This is important to improve blood flow and breathing. Ask for help if you feel weak or unsteady.  Do not lift anything that is heavier than 10 lb (4.5 kg), or the limit that you are told, until your health care provider says that it is safe.  If you were given a sedative during the procedure, it can affect you for several hours. Do not drive or operate machinery until your health care provider says that it is safe.   Lifestyle  Do not use any products that contain nicotine or tobacco. These products include cigarettes, chewing tobacco, and vaping devices, such as e-cigarettes. These can delay healing after surgery. If you need help quitting, ask your health care provider.  Do not drink alcohol until your health care provider approves. General  instructions  Do not douche, use tampons, or have sex for at least 6 weeks, or as told by your health care provider.  If you struggle with physical or emotional changes after your procedure, speak with your health care provider or a therapist.  Do not take baths, swim, or use a hot tub until your health care provider approves. You may only be allowed to take showers for 2-3 weeks.  Keep your  dressing dry until your health care provider says it can be removed.  Try to have someone at home with you for the first 1-2 weeks to help with your daily chores.  Wear compression stockings as told by your health care provider. These stockings help to prevent blood clots and reduce swelling in your legs.  Keep all follow-up visits. This is important.   Contact a health care provider if:  You have any of these signs of infection: ? More redness, swelling, warmth, or pain around an incision. ? Fluid or blood coming from an incision. ? Pus or a bad smell coming from an incision. ? Chills or a fever.  An incision opens.  Your pain medicine is not helping.  You feel dizzy or light-headed.  You have pain or bleeding when you urinate.  You have nausea and vomiting that does not go away.  You have pus, or a bad-smelling discharge coming from your vagina. Get help right away if:  You have a fever and your symptoms suddenly get worse.  You have severe abdominal pain.  You have chest pain.  You have shortness of breath.  You faint.  You have pain, swelling, or redness in your leg.  You have heavy vaginal bleeding and blood clots, soaking through a sanitary pad in less than 1 hour. These symptoms may represent a serious problem that is an emergency. Do not wait to see if the symptoms will go away. Get medical help right away. Call your local emergency services (911 in the [place] Do not drive yourself to the hospital. Summary  After the procedure, it is common to have abdominal pain and vaginal bleeding.  Wear a sanitary pad for vaginal discharge or bleeding.  You should not drive or lift heavy objects until your health care provider says that it is safe.  Contact your health care provider if you have any symptoms of infection, heavy vaginal bleeding, nausea, vomiting, or shortness of breath. This information is not intended to replace advice given to you by your health care  provider. Make sure you discuss any questions you have with your health care provider. Document Revised: 10/19/2019 Document Reviewed: 10/19/2019 Elsevier Patient Education  [place].

## 2020-06-20 ENCOUNTER — Other Ambulatory Visit: Payer: Medicaid Other

## 2020-06-20 ENCOUNTER — Encounter
Admission: RE | Admit: 2020-06-20 | Discharge: 2020-06-20 | Disposition: A | Discharge status: Home / Self Care | Payer: Medicaid Other | Source: Ambulatory Visit | Attending: Obstetrics and Gynecology | Admitting: Obstetrics and Gynecology

## 2020-06-20 ENCOUNTER — Other Ambulatory Visit: Payer: Self-pay

## 2020-06-20 DIAGNOSIS — Z01812 Encounter for preprocedural laboratory examination: Secondary | ICD-10-CM | POA: Insufficient documentation

## 2020-06-20 DIAGNOSIS — Z20822 Contact with and (suspected) exposure to covid-19: Secondary | ICD-10-CM | POA: Insufficient documentation

## 2020-06-20 LAB — CBC
HCT: 39.2 % (ref 36.0–46.0)
Hemoglobin: 13.2 g/dL (ref 12.0–15.0)
MCH: 30.1 pg (ref 26.0–34.0)
MCHC: 33.7 g/dL (ref 30.0–36.0)
MCV: 89.3 fL (ref 80.0–100.0)
Platelets: 337 10*3/uL (ref 150–400)
RBC: 4.39 MIL/uL (ref 3.87–5.11)
RDW: 13.2 % (ref 11.5–15.5)
WBC: 10.4 10*3/uL (ref 4.0–10.5)
nRBC: 0 % (ref 0.0–0.2)

## 2020-06-20 LAB — TYPE AND SCREEN
ABO/RH(D): O POS
Antibody Screen: NEGATIVE

## 2020-06-20 LAB — BASIC METABOLIC PANEL
Anion gap: 7 (ref 5–15)
BUN: 15 mg/dL (ref 6–20)
CO2: 24 mmol/L (ref 22–32)
Calcium: 9.3 mg/dL (ref 8.9–10.3)
Chloride: 105 mmol/L (ref 98–111)
Creatinine, Ser: 0.63 mg/dL (ref 0.44–1.00)
GFR, Estimated: >60 mL/min (ref >60)
Glucose, Bld: 101 mg/dL — ABNORMAL HIGH (ref 70–99)
Potassium: 4.1 mmol/L (ref 3.5–5.1)
Sodium: 136 mmol/L (ref 135–145)

## 2020-06-20 LAB — SARS CORONAVIRUS 2 (TAT 6-24 HRS): SARS Coronavirus 2: NEGATIVE

## 2020-06-23 MED ORDER — LACTATED RINGERS IV SOLN: INTRAVENOUS | Status: DC

## 2020-06-23 MED ORDER — CHLORHEXIDINE GLUCONATE 0.12 % MT SOLN: 15.0000 mL | Freq: Once | OROMUCOSAL | Status: AC

## 2020-06-23 MED ORDER — POVIDONE-IODINE 10 % EX SWAB: 2.0000 "application " | Freq: Once | CUTANEOUS | Status: DC

## 2020-06-23 MED ORDER — CEFAZOLIN SODIUM-DEXTROSE 2-4 GM/100ML-% IV SOLN
2.0000 g | INTRAVENOUS | Status: AC
  Administered 2020-06-24: 2 g via INTRAVENOUS

## 2020-06-23 MED ORDER — ORAL CARE MOUTH RINSE: 15.0000 mL | Freq: Once | OROMUCOSAL | Status: AC

## 2020-06-24 ENCOUNTER — Encounter
Admission: RE | Disposition: A | Discharge status: Home / Self Care | Payer: Self-pay | Source: Ambulatory Visit | Attending: Obstetrics and Gynecology

## 2020-06-24 ENCOUNTER — Ambulatory Visit: Payer: Medicaid Other | Admitting: Anesthesiology

## 2020-06-24 ENCOUNTER — Encounter: Payer: Self-pay | Admitting: Obstetrics and Gynecology

## 2020-06-24 ENCOUNTER — Ambulatory Visit
Admission: RE | Admit: 2020-06-24 | Discharge: 2020-06-24 | Disposition: A | Discharge status: Home / Self Care | Payer: Medicaid Other | Source: Ambulatory Visit | Attending: Obstetrics and Gynecology | Admitting: Obstetrics and Gynecology

## 2020-06-24 DIAGNOSIS — F17200 Nicotine dependence, unspecified, uncomplicated: Secondary | ICD-10-CM | POA: Insufficient documentation

## 2020-06-24 DIAGNOSIS — N736 Female pelvic peritoneal adhesions (postinfective): Secondary | ICD-10-CM | POA: Insufficient documentation

## 2020-06-24 DIAGNOSIS — N939 Abnormal uterine and vaginal bleeding, unspecified: Secondary | ICD-10-CM | POA: Diagnosis not present

## 2020-06-24 DIAGNOSIS — R102 Pelvic and perineal pain: Secondary | ICD-10-CM | POA: Diagnosis not present

## 2020-06-24 DIAGNOSIS — N921 Excessive and frequent menstruation with irregular cycle: Secondary | ICD-10-CM

## 2020-06-24 DIAGNOSIS — N836 Hematosalpinx: Secondary | ICD-10-CM | POA: Diagnosis not present

## 2020-06-24 DIAGNOSIS — Z79899 Other long term (current) drug therapy: Secondary | ICD-10-CM | POA: Insufficient documentation

## 2020-06-24 DIAGNOSIS — N838 Other noninflammatory disorders of ovary, fallopian tube and broad ligament: Secondary | ICD-10-CM

## 2020-06-24 DIAGNOSIS — N946 Dysmenorrhea, unspecified: Secondary | ICD-10-CM | POA: Insufficient documentation

## 2020-06-24 DIAGNOSIS — K660 Peritoneal adhesions (postprocedural) (postinfection): Secondary | ICD-10-CM

## 2020-06-24 DIAGNOSIS — N920 Excessive and frequent menstruation with regular cycle: Secondary | ICD-10-CM | POA: Insufficient documentation

## 2020-06-24 HISTORY — PX: CYSTOSCOPY: SHX5120

## 2020-06-24 HISTORY — PX: ROBOTIC ASSISTED LAPAROSCOPIC HYSTERECTOMY AND SALPINGECTOMY: SHX6379

## 2020-06-24 LAB — POCT PREGNANCY, URINE: Preg Test, Ur: NEGATIVE

## 2020-06-24 LAB — ABO/RH: ABO/RH(D): O POS

## 2020-06-24 SURGERY — XI ROBOTIC ASSISTED LAPAROSCOPIC HYSTERECTOMY AND SALPINGECTOMY
Anesthesia: General

## 2020-06-24 MED ORDER — ONDANSETRON HCL 4 MG/2ML IJ SOLN
INTRAMUSCULAR | Status: DC | PRN
  Administered 2020-06-24: 4 mg via INTRAVENOUS

## 2020-06-24 MED ORDER — IBUPROFEN 600 MG PO TABS
600.0000 mg | ORAL_TABLET | Freq: Four times a day (QID) | ORAL | 0 refills | Status: DC | PRN
Start: 2020-06-24 — End: 2021-02-06

## 2020-06-24 MED ORDER — DEXAMETHASONE SODIUM PHOSPHATE 10 MG/ML IJ SOLN
INTRAMUSCULAR | Status: DC | PRN
  Administered 2020-06-24: 10 mg via INTRAVENOUS

## 2020-06-24 MED ORDER — DOCUSATE SODIUM 100 MG PO CAPS: 100.0000 mg | ORAL_CAPSULE | Freq: Two times a day (BID) | ORAL | 0 refills | Status: DC | PRN

## 2020-06-24 MED ORDER — SODIUM CHLORIDE FLUSH 0.9 % IV SOLN
INTRAVENOUS | Status: AC
  Filled 2020-06-24: qty 10

## 2020-06-24 MED ORDER — MIDAZOLAM HCL 2 MG/2ML IJ SOLN
INTRAMUSCULAR | Status: DC | PRN
  Administered 2020-06-24: 2 mg via INTRAVENOUS

## 2020-06-24 MED ORDER — PHENYLEPHRINE HCL (PRESSORS) 10 MG/ML IV SOLN
INTRAVENOUS | Status: DC | PRN
  Administered 2020-06-24 (×5): 100 ug via INTRAVENOUS

## 2020-06-24 MED ORDER — BUPIVACAINE LIPOSOME 1.3 % IJ SUSP
INTRAMUSCULAR | Status: DC | PRN
  Administered 2020-06-24: 20 mL

## 2020-06-24 MED ORDER — SCOPOLAMINE 1 MG/3DAYS TD PT72
1.0000 | MEDICATED_PATCH | Freq: Once | TRANSDERMAL | Status: DC
  Administered 2020-06-24: 1.5 mg via TRANSDERMAL

## 2020-06-24 MED ORDER — NICOTINE 21 MG/24HR TD PT24: 21.0000 mg | MEDICATED_PATCH | Freq: Every day | TRANSDERMAL | 0 refills | Status: DC

## 2020-06-24 MED ORDER — DEXAMETHASONE SODIUM PHOSPHATE 10 MG/ML IJ SOLN
INTRAMUSCULAR | Status: AC
  Filled 2020-06-24: qty 1

## 2020-06-24 MED ORDER — EPHEDRINE 5 MG/ML INJ
INTRAVENOUS | Status: AC
  Filled 2020-06-24: qty 10

## 2020-06-24 MED ORDER — OXYCODONE HCL 5 MG PO TABS: 5.0000 mg | ORAL_TABLET | Freq: Once | ORAL | Status: DC | PRN

## 2020-06-24 MED ORDER — MIDAZOLAM HCL 2 MG/2ML IJ SOLN
INTRAMUSCULAR | Status: AC
  Filled 2020-06-24: qty 2

## 2020-06-24 MED ORDER — DEXMEDETOMIDINE (PRECEDEX) IN NS 20 MCG/5ML (4 MCG/ML) IV SYRINGE
PREFILLED_SYRINGE | INTRAVENOUS | Status: AC
  Filled 2020-06-24: qty 5

## 2020-06-24 MED ORDER — KETAMINE HCL 10 MG/ML IJ SOLN
INTRAMUSCULAR | Status: DC | PRN
  Administered 2020-06-24: 30 mg via INTRAVENOUS

## 2020-06-24 MED ORDER — SUGAMMADEX SODIUM 200 MG/2ML IV SOLN
INTRAVENOUS | Status: DC | PRN
  Administered 2020-06-24: 200 mg via INTRAVENOUS

## 2020-06-24 MED ORDER — FENTANYL CITRATE (PF) 100 MCG/2ML IJ SOLN
INTRAMUSCULAR | Status: DC | PRN
  Administered 2020-06-24 (×2): 50 ug via INTRAVENOUS

## 2020-06-24 MED ORDER — CHLORHEXIDINE GLUCONATE 0.12 % MT SOLN
OROMUCOSAL | Status: AC
  Administered 2020-06-24: 15 mL via OROMUCOSAL
  Filled 2020-06-24: qty 15

## 2020-06-24 MED ORDER — ACETAMINOPHEN 500 MG PO TABS: 500.0000 mg | ORAL_TABLET | Freq: Four times a day (QID) | ORAL | 0 refills | Status: DC

## 2020-06-24 MED ORDER — DEXMEDETOMIDINE (PRECEDEX) IN NS 20 MCG/5ML (4 MCG/ML) IV SYRINGE
PREFILLED_SYRINGE | INTRAVENOUS | Status: DC | PRN
  Administered 2020-06-24: 8 ug via INTRAVENOUS
  Administered 2020-06-24: 4 ug via INTRAVENOUS

## 2020-06-24 MED ORDER — SCOPOLAMINE 1 MG/3DAYS TD PT72
MEDICATED_PATCH | TRANSDERMAL | Status: AC
  Filled 2020-06-24: qty 1

## 2020-06-24 MED ORDER — ACETAMINOPHEN 10 MG/ML IV SOLN
INTRAVENOUS | Status: DC | PRN
  Administered 2020-06-24: 1000 mg via INTRAVENOUS

## 2020-06-24 MED ORDER — GLYCOPYRROLATE 0.2 MG/ML IJ SOLN
INTRAMUSCULAR | Status: DC | PRN
  Administered 2020-06-24: .2 mg via INTRAVENOUS

## 2020-06-24 MED ORDER — OXYCODONE HCL 5 MG PO TABS: 5.0000 mg | ORAL_TABLET | Freq: Four times a day (QID) | ORAL | 0 refills | Status: DC | PRN

## 2020-06-24 MED ORDER — PROPOFOL 10 MG/ML IV BOLUS
INTRAVENOUS | Status: AC
  Filled 2020-06-24: qty 20

## 2020-06-24 MED ORDER — KETAMINE HCL 50 MG/5ML IJ SOSY
PREFILLED_SYRINGE | INTRAMUSCULAR | Status: AC
  Filled 2020-06-24: qty 5

## 2020-06-24 MED ORDER — PROPOFOL 500 MG/50ML IV EMUL
INTRAVENOUS | Status: DC | PRN
  Administered 2020-06-24: 20 ug/kg/min via INTRAVENOUS

## 2020-06-24 MED ORDER — LIDOCAINE HCL (CARDIAC) PF 100 MG/5ML IV SOSY
PREFILLED_SYRINGE | INTRAVENOUS | Status: DC | PRN
  Administered 2020-06-24: 80 mg via INTRAVENOUS

## 2020-06-24 MED ORDER — LIDOCAINE HCL (PF) 2 % IJ SOLN
INTRAMUSCULAR | Status: AC
  Filled 2020-06-24: qty 5

## 2020-06-24 MED ORDER — ONDANSETRON HCL 4 MG/2ML IJ SOLN
INTRAMUSCULAR | Status: AC
  Filled 2020-06-24: qty 2

## 2020-06-24 MED ORDER — ROCURONIUM BROMIDE 100 MG/10ML IV SOLN
INTRAVENOUS | Status: DC | PRN
  Administered 2020-06-24: 20 mg via INTRAVENOUS
  Administered 2020-06-24 (×2): 10 mg via INTRAVENOUS
  Administered 2020-06-24: 50 mg via INTRAVENOUS
  Administered 2020-06-24: 10 mg via INTRAVENOUS

## 2020-06-24 MED ORDER — OXYCODONE HCL 5 MG/5ML PO SOLN: 5.0000 mg | Freq: Once | ORAL | Status: DC | PRN

## 2020-06-24 MED ORDER — FENTANYL CITRATE (PF) 100 MCG/2ML IJ SOLN
INTRAMUSCULAR | Status: AC
  Filled 2020-06-24: qty 2

## 2020-06-24 MED ORDER — ALBUTEROL SULFATE HFA 108 (90 BASE) MCG/ACT IN AERS
INHALATION_SPRAY | RESPIRATORY_TRACT | Status: DC | PRN
  Administered 2020-06-24: 2 via RESPIRATORY_TRACT
  Administered 2020-06-24: 6 via RESPIRATORY_TRACT

## 2020-06-24 MED ORDER — FENTANYL CITRATE (PF) 100 MCG/2ML IJ SOLN: 25.0000 ug | INTRAMUSCULAR | Status: DC | PRN

## 2020-06-24 MED ORDER — SODIUM CHLORIDE 0.9 % IV SOLN
INTRAVENOUS | Status: DC | PRN
  Administered 2020-06-24: 40 ug/min via INTRAVENOUS

## 2020-06-24 MED ORDER — PROMETHAZINE HCL 25 MG/ML IJ SOLN: 6.2500 mg | INTRAMUSCULAR | Status: DC | PRN

## 2020-06-24 MED ORDER — KETOROLAC TROMETHAMINE 30 MG/ML IJ SOLN
INTRAMUSCULAR | Status: AC
  Filled 2020-06-24: qty 1

## 2020-06-24 MED ORDER — KETOROLAC TROMETHAMINE 30 MG/ML IJ SOLN
INTRAMUSCULAR | Status: DC | PRN
  Administered 2020-06-24: 30 mg via INTRAVENOUS

## 2020-06-24 MED ORDER — BUPIVACAINE LIPOSOME 1.3 % IJ SUSP
INTRAMUSCULAR | Status: AC
  Filled 2020-06-24: qty 20

## 2020-06-24 MED ORDER — CEFAZOLIN SODIUM-DEXTROSE 2-4 GM/100ML-% IV SOLN
INTRAVENOUS | Status: AC
  Filled 2020-06-24: qty 100

## 2020-06-24 MED ORDER — ROCURONIUM BROMIDE 10 MG/ML (PF) SYRINGE
PREFILLED_SYRINGE | INTRAVENOUS | Status: AC
  Filled 2020-06-24: qty 10

## 2020-06-24 MED ORDER — SUCCINYLCHOLINE CHLORIDE 200 MG/10ML IV SOSY
PREFILLED_SYRINGE | INTRAVENOUS | Status: AC
  Filled 2020-06-24: qty 10

## 2020-06-24 MED ORDER — PROPOFOL 10 MG/ML IV BOLUS
INTRAVENOUS | Status: DC | PRN
  Administered 2020-06-24: 150 mg via INTRAVENOUS

## 2020-06-24 SURGICAL SUPPLY — 96 items
ADH SKN CLS APL DERMABOND .7 (GAUZE/BANDAGES/DRESSINGS) ×2
APL PRP STRL LF DISP 70% ISPRP (MISCELLANEOUS) ×2
APL SRG 38 LTWT LNG FL B (MISCELLANEOUS) ×2
APPLICATOR ARISTA FLEXITIP XL (MISCELLANEOUS) ×3 IMPLANT
BAG DRN RND TRDRP ANRFLXCHMBR (UROLOGICAL SUPPLIES) ×2
BAG LAPAROSCOPIC 12 15 PORT 16 (BASKET) IMPLANT
BAG RETRIEVAL 12/15 (BASKET)
BAG URINE DRAIN 2000ML AR STRL (UROLOGICAL SUPPLIES) ×3 IMPLANT
BASIN GRAD PLASTIC 32OZ STRL (MISCELLANEOUS) ×3 IMPLANT
BLADE SURG 11 STRL SS SAFETY (MISCELLANEOUS) IMPLANT
BLADE SURG SZ10 CARB STEEL (BLADE) ×3 IMPLANT
BLADE SURG SZ11 CARB STEEL (BLADE) ×3 IMPLANT
CANNULA REDUC XI 12-8 STAPL (CANNULA) ×1
CANNULA REDUCER 12-8 DVNC XI (CANNULA) ×2 IMPLANT
CATH FOLEY 2WAY  5CC 16FR (CATHETERS) ×1
CATH FOLEY 2WAY 5CC 16FR (CATHETERS) ×2
CATH URTH 16FR FL 2W BLN LF (CATHETERS) ×2 IMPLANT
CHLORAPREP W/TINT 26 (MISCELLANEOUS) ×3 IMPLANT
COVER BACK TABLE REUSABLE LG (DRAPES) ×3 IMPLANT
COVER MAYO STAND REUSABLE (DRAPES) ×3 IMPLANT
COVER TIP SHEARS 8 DVNC (MISCELLANEOUS) ×2 IMPLANT
COVER TIP SHEARS 8MM DA VINCI (MISCELLANEOUS) ×1
COVER WAND RF STERILE (DRAPES) ×6 IMPLANT
DEFOGGER SCOPE WARMER CLEARIFY (MISCELLANEOUS) ×3 IMPLANT
DERMABOND ADVANCED (GAUZE/BANDAGES/DRESSINGS) ×1
DERMABOND ADVANCED .7 DNX12 (GAUZE/BANDAGES/DRESSINGS) ×2 IMPLANT
DRAPE 3/4 80X56 (DRAPES) ×6 IMPLANT
DRAPE ARM DVNC X/XI (DISPOSABLE) ×8 IMPLANT
DRAPE COLUMN DVNC XI (DISPOSABLE) ×2 IMPLANT
DRAPE DA VINCI XI ARM (DISPOSABLE) ×4
DRAPE DA VINCI XI COLUMN (DISPOSABLE) ×1
DRAPE UNDER BUTTOCK W/FLU (DRAPES) ×3 IMPLANT
DRSG TEGADERM 2-3/8X2-3/4 SM (GAUZE/BANDAGES/DRESSINGS) ×12 IMPLANT
ELECT REM PT RETURN 9FT ADLT (ELECTROSURGICAL) ×3
ELECTRODE REM PT RTRN 9FT ADLT (ELECTROSURGICAL) ×2 IMPLANT
EXTRT SYSTEM ALEXIS 17CM (MISCELLANEOUS)
GAUZE 4X4 16PLY RFD (DISPOSABLE) ×3 IMPLANT
GLOVE SURG ENC MOIS LTX SZ7 (GLOVE) ×6 IMPLANT
GLOVE SURG SYN 6.5 ES PF (GLOVE) ×6 IMPLANT
GLOVE SURG UNDER POLY LF SZ6.5 (GLOVE) ×3 IMPLANT
GLOVE SURG UNDER POLY LF SZ7.5 (GLOVE) ×6 IMPLANT
GOWN STRL REUS W/ TWL LRG LVL3 (GOWN DISPOSABLE) ×24 IMPLANT
GOWN STRL REUS W/TWL LRG LVL3 (GOWN DISPOSABLE) ×36
GRASPER SUT TROCAR 14GX15 (MISCELLANEOUS) ×3 IMPLANT
HEMOSTAT ARISTA ABSORB 3G PWDR (HEMOSTASIS) ×3 IMPLANT
IRRIGATION STRYKERFLOW (MISCELLANEOUS) IMPLANT
IRRIGATOR STRYKERFLOW (MISCELLANEOUS)
IRRIGATOR SUCT 8 DISP DVNC XI (IRRIGATION / IRRIGATOR) IMPLANT
IRRIGATOR SUCTION 8MM XI DISP (IRRIGATION / IRRIGATOR)
IV NS 1000ML (IV SOLUTION) ×6
IV NS 1000ML BAXH (IV SOLUTION) ×4 IMPLANT
KIT PINK PAD W/HEAD ARE REST (MISCELLANEOUS) ×3
KIT PINK PAD W/HEAD ARM REST (MISCELLANEOUS) ×2 IMPLANT
LABEL OR SOLS (LABEL) ×3 IMPLANT
MANIFOLD NEPTUNE II (INSTRUMENTS) ×3 IMPLANT
MANIPULATOR VCARE LG CRV RETR (MISCELLANEOUS) IMPLANT
MANIPULATOR VCARE SML CRV RETR (MISCELLANEOUS) IMPLANT
MANIPULATOR VCARE STD CRV RETR (MISCELLANEOUS) ×3 IMPLANT
NEEDLE HYPO 22GX1.5 SAFETY (NEEDLE) ×3 IMPLANT
NS IRRIG 1000ML POUR BTL (IV SOLUTION) ×6 IMPLANT
OBTURATOR OPTICAL STANDARD 8MM (TROCAR) ×1
OBTURATOR OPTICAL STND 8 DVNC (TROCAR) ×2
OBTURATOR OPTICALSTD 8 DVNC (TROCAR) ×2 IMPLANT
OCCLUDER COLPOPNEUMO (BALLOONS) ×3 IMPLANT
PACK GYN LAPAROSCOPIC (MISCELLANEOUS) ×3 IMPLANT
PAD ARMBOARD 7.5X6 YLW CONV (MISCELLANEOUS) ×3 IMPLANT
PAD OB MATERNITY 4.3X12.25 (PERSONAL CARE ITEMS) ×3 IMPLANT
PAD PREP 24X41 OB/GYN DISP (PERSONAL CARE ITEMS) ×3 IMPLANT
PENCIL ELECTRO HAND CTR (MISCELLANEOUS) ×3 IMPLANT
RETRACTOR WOUND ALXS 18CM SML (MISCELLANEOUS) IMPLANT
RTRCTR WOUND ALEXIS O 18CM SML (MISCELLANEOUS)
SEAL CANN UNIV 5-8 DVNC XI (MISCELLANEOUS) ×6 IMPLANT
SEAL XI 5MM-8MM UNIVERSAL (MISCELLANEOUS) ×3
SEALER VESSEL DA VINCI XI (MISCELLANEOUS) ×1
SEALER VESSEL EXT DVNC XI (MISCELLANEOUS) ×2 IMPLANT
SET CYSTO W/LG BORE CLAMP LF (SET/KITS/TRAYS/PACK) ×3 IMPLANT
SET TRI-LUMEN FLTR TB AIRSEAL (TUBING) ×3 IMPLANT
SOLUTION ELECTROLUBE (MISCELLANEOUS) ×3 IMPLANT
SPONGE GAUZE 2X2 8PLY STRL LF (GAUZE/BANDAGES/DRESSINGS) ×12 IMPLANT
SPONGE LAP 18X18 RF (DISPOSABLE) IMPLANT
STAPLER CANNULA SEAL DVNC XI (STAPLE) ×2 IMPLANT
STAPLER CANNULA SEAL XI (STAPLE) ×1
SURGILUBE 2OZ TUBE FLIPTOP (MISCELLANEOUS) ×3 IMPLANT
SUT DVC VLOC 180 0 12IN GS21 (SUTURE) ×3
SUT DVC VLOC 3-0 CL 6 P-12 (SUTURE) ×6 IMPLANT
SUT MNCRL 4-0 (SUTURE) ×3
SUT MNCRL 4-0 27XMFL (SUTURE) ×2
SUT VIC AB 0 CT1 36 (SUTURE) ×3 IMPLANT
SUT VLOC 180 0 6IN GS21 (SUTURE) ×3 IMPLANT
SUTURE DVC VLC 180 0 12IN GS21 (SUTURE) ×2 IMPLANT
SUTURE MNCRL 4-0 27XMF (SUTURE) ×2 IMPLANT
SYR 10ML LL (SYRINGE) ×3 IMPLANT
SYR 50ML LL SCALE MARK (SYRINGE) ×3 IMPLANT
SYSTEM CONTND EXTRCTN KII BLLN (MISCELLANEOUS) IMPLANT
TROCAR PORT AIRSEAL 5X120 (TROCAR) ×3 IMPLANT
TROCAR PORT AIRSEAL 8X100 (TROCAR) ×3 IMPLANT

## 2020-06-24 NOTE — Op Note (Signed)
Operative Note   PRE-OP DIAGNOSIS:  Menorrhagia, pelvic pain  POST-OP DIAGNOSIS: Menorrhagia, pelvic pain  SURGEON: Adrian Prows MD  ASSISTANT:  Malachy Mood  MD  ANESTHESIA: General  PROCEDURE: Procedure(s):Robotic assisted total hysterectomy and bilateral salpingectomy  ESTIMATED BLOOD LOSS: 25 cc  DRAINS: Foley  SPECIMENS:  Uterus, cervix, and bilateral fallopian tubes  COMPLICATIONS: None  DISPOSITION: PACU  CONDITION: Stable  INDICATIONS: Menorrhagia and pelvic pain, prior sterilization  FINDINGS: Exam under anesthesia revealed an enlarged 13  week mobile globular uterus. There were no adnexal masses or nodularity. The parametria was smooth. The cervix was negative for gross lesions. Intraoperative findings included: The uterus was enlarged and enlongated to 13 cm. There were omental adhesions to the anterior abdominal wall. The uterus was adherent to the anterior abdomen.  The adnexa were  normal bilaterally. The upper abdomen was normal including omentum, bowel, liver, stomach, and diaphragmatic surfaces. There was no evidence of grossly enlarged pelvic or right para-aortic lymph nodes.   PROCEDURE IN DETAIL: After informed consent was obtained, the patient was taken to the operating room where anesthesia was obtained without difficulty. The patient was positioned in the dorsal lithotomy position in [place] and her arms were carefully tucked at her sides and the usual precautions were taken.  She was prepped and draped in normal sterile fashion.  Time-out was performed. A foley catheter was placed. A speculum was placed in the vagina and the cervical os was dilator. The uterus sounded to 13 cm.  A standard VCare uterine manipulator was then placed in the uterus without incident.    Laparoscopic entry was obtained via a supraumbilical incision and direct entry. The 37mm robotic optiview port was placed, abdomen insuffulated, and pelvis visualized with noted  findings above.  The patient was placed in Trendelenburg and the bowel was displaced up into the upper abdomen.  The 3 additional robotic port were placed in a horizontal line across the upper abdomen. Robotic docking was performed.   Omental adhesions along the anterior abdominal wall were lysed with the Vessel sealer.  There were significant adhesions across the entire uterine fundus.  The left fallopian tube was lysed from the anterior abdominal wall.   The right fallopian tube remnant was identified and resected with the vessel sealer to remove the right fallopian tube. The right round ligaments and the uterine ovarian ligaments was divided. The broad ligament was dissected carefully. Significant anterior adhesions which were gently lysed. Bladder flap was not able to initially be created, but the uterine artery was skeletonized, sealed and divided with the Vesselsealer device or cautery. Attention was turned to the left were the same procedure was repeated.   Significant anterior uterine and bladder adhesions remained. A window was able to be created alond the anterior cervix. Lysis of adhesions and careful dissection took more than 30 minutes.   Once this window was made and the uterine arteries were ligated bilaterally the uterus was excised from the anterior abdominal wall. The bladder flap was then able to be created. The bladder was dissected down off the lower uterine segment and cervix.  A colpotomy was performed circumferentially along the V-Care ring with electrocautery and the cervix was incised from the vagina. The uterus, fallopian tube fragments, and v-care device were removed through the vagina. The distal portions of the fallopian tubes were removed from the ovaries and passed through the vagina.    A pneumo balloon was placed in the vagina and the vaginal cuff was then  closed in a running continuous fashion using 0 V-Lock suture with careful attention to include the vaginal cuff  angles and the vaginal mucosa within the closure.   Arista was applied to the vaginal cuff.     The trocars were removed. The skin incision at the umbilicus was closed with subcuticular stitch.  The remaining skin incisions were closed with subcuticular stitch and Indermil glue.  The patient tolerated the procedure well.  Sponge, lap and needle counts were correct x2.  The patient was taken to recovery room in excellent condition.  Cystoscopy was performed and efflux of urine was noted from bilateral ureters.  The cystoscope was removed from the bladder and the bladder was drained.   Dr. Georgianne Fick assisted with this case. This was a high level case requiring a Physicist, medical. No other assistant was readily available. He assisted with port placement, irrigation and manipulation of tissue during the case.   Adrian Prows MD, [facility], [facility] 06/24/2020 4:09 PM

## 2020-06-24 NOTE — Interval H&P Note (Signed)
History and Physical Interval Note:  06/24/2020 11:26 AM  Joyce Swanson  has presented today for surgery, with the diagnosis of Menorrhagia, dysmenorrhea, abnormal uterine bleeding.  The various methods of treatment have been discussed with the patient and family. After consideration of risks, benefits and other options for treatment, the patient has consented to  Procedure(s): XI ROBOTIC ASSISTED LAPAROSCOPIC HYSTERECTOMY AND BILATERAL SALPINGECTOMY (Bilateral) CYSTOSCOPY (N/A) as a surgical intervention.  The patient's history has been reviewed, patient examined, no change in status, stable for surgery.  I have reviewed the patient's chart and labs.  Questions were answered to the patient's satisfaction.     [place]

## 2020-06-24 NOTE — Anesthesia Preprocedure Evaluation (Addendum)
Anesthesia Evaluation  Patient identified by MRN, date of birth, ID band Patient awake    Reviewed: Allergy & Precautions, H&P , NPO status , Patient's Chart, lab work & pertinent test results  History of Anesthesia Complications Negative for: history of anesthetic complications  Airway Mallampati: II  TM Distance: >3 FB     Dental  (+) Teeth Intact   Pulmonary asthma (childhood) , sleep apnea , neg COPD, Current Smoker and Patient abstained from smoking.,    breath sounds clear to auscultation       Cardiovascular (-) angina(-) Past MI and (-) Cardiac Stents negative cardio ROS  (-) dysrhythmias  Rhythm:regular Rate:Normal     Neuro/Psych PSYCHIATRIC DISORDERS Anxiety Depression negative neurological ROS     GI/Hepatic negative GI ROS, Neg liver ROS,   Endo/Other    Renal/GU      Musculoskeletal   Abdominal   Peds  Hematology negative hematology ROS (+)   Anesthesia Other Findings Past Medical History: No date: Anemia No date: Anxiety No date: Asthma     Comment:  as a child 04/2020: Bronchitis No date: Depression No date: Fibroid, uterine No date: Gestational diabetes No date: Liver mass 2021: Pneumonia No date: Sleep apnea  Past Surgical History: 2013, 2015: CESAREAN SECTION No date: TUBAL LIGATION     Reproductive/Obstetrics negative OB ROS                           Anesthesia Physical Anesthesia Plan  ASA: II  Anesthesia Plan: General ETT   Post-op Pain Management:    Induction:   PONV Risk Score and Plan: Ondansetron, Dexamethasone, Midazolam, Treatment may vary due to age or medical condition, Propofol infusion and Scopolamine patch - Pre-op  Airway Management Planned:   Additional Equipment:   Intra-op Plan:   Post-operative Plan:   Informed Consent: I have reviewed the patients History and Physical, chart, labs and discussed the procedure including  the risks, benefits and alternatives for the proposed anesthesia with the patient or authorized representative who has indicated his/her understanding and acceptance.     Dental Advisory Given  Plan Discussed with: Anesthesiologist, CRNA and Surgeon  Anesthesia Plan Comments:        Anesthesia Quick Evaluation

## 2020-06-24 NOTE — Anesthesia Procedure Notes (Signed)
Procedure Name: Intubation Date/Time: 06/24/2020 11:54 AM Performed by: Joellyn Quails, RN Pre-anesthesia Checklist: Patient identified, Patient being monitored, Timeout performed, Emergency Drugs available and Suction available Patient Re-evaluated:Patient Re-evaluated prior to induction Oxygen Delivery Method: Circle system utilized Preoxygenation: Pre-oxygenation with 100% oxygen Induction Type: IV induction Ventilation: Mask ventilation without difficulty Laryngoscope Size: 3 and Glidescope Grade View: Grade I Tube type: Oral Tube size: 7.0 mm Number of attempts: 1 Airway Equipment and Method: Stylet and Video-laryngoscopy Placement Confirmation: ETT inserted through vocal cords under direct vision,  positive ETCO2 and breath sounds checked- equal and bilateral Secured at: 21 cm Tube secured with: Tape Dental Injury: Teeth and Oropharynx as per pre-operative assessment

## 2020-06-24 NOTE — Transfer of Care (Signed)
Immediate Anesthesia Transfer of Care Note  Patient: Joyce Swanson  Procedure(s) Performed: XI ROBOTIC ASSISTED LAPAROSCOPIC HYSTERECTOMY AND BILATERAL SALPINGECTOMY (Bilateral ) CYSTOSCOPY (N/A )  Patient Location: PACU  Anesthesia Type:General  Level of Consciousness: awake, alert  and oriented  Airway & Oxygen Therapy: Patient Spontanous Breathing and Patient connected to face mask oxygen  Post-op Assessment: Report given to RN and Post -op Vital signs reviewed and stable  Post vital signs: Reviewed and stable  Last Vitals:  Vitals Value Taken Time  BP 94/52 06/24/20 1523  Temp    Pulse 85 06/24/20 1525  Resp 13 06/24/20 1525  SpO2 94 % 06/24/20 1525  Vitals shown include unvalidated device data.  Last Pain:  Vitals:   06/24/20 1014  TempSrc: Temporal  PainSc: 6          Complications: No complications documented.

## 2020-06-24 NOTE — Progress Notes (Signed)
Pt with oral airway in place on arrival to PACU.  After about 53mins, pt began gagging and pushing oral airway out. She developed a rigid jaw (unable to do lift) after multiple attempts and doing sternal rub, pt relaxed and started taking deep breaths.  Loud snoring noted along with periodic obstruction.  HOB elevated, high Fowler's, cont simple fm until pt more arousable.

## 2020-06-24 NOTE — Anesthesia Postprocedure Evaluation (Signed)
Anesthesia Post Note  Patient: Joyce Swanson  Procedure(s) Performed: XI ROBOTIC ASSISTED LAPAROSCOPIC HYSTERECTOMY AND BILATERAL SALPINGECTOMY (Bilateral ) CYSTOSCOPY (N/A )  Patient location during evaluation: PACU Anesthesia Type: General Level of consciousness: awake and alert Pain management: pain level controlled Vital Signs Assessment: post-procedure vital signs reviewed and stable Respiratory status: spontaneous breathing and respiratory function stable Cardiovascular status: stable Anesthetic complications: no   No complications documented.   Last Vitals:  Vitals:   06/24/20 1607 06/24/20 1608  BP: 109/67 112/79  Pulse: 90 100  Resp: 12 14  Temp:  (!) 36.2 C  SpO2: 100% 97%    Last Pain:  Vitals:   06/24/20 1608  TempSrc:   PainSc: 0-No pain                 Tymere Depuy K

## 2020-06-24 NOTE — Discharge Instructions (Signed)
AMBULATORY SURGERY  DISCHARGE INSTRUCTIONS   1) The drugs that you were given will stay in your system until tomorrow so for the next 24 hours you should not:  A) Drive an automobile B) Make any legal decisions C) Drink any alcoholic beverage   2) You may resume regular meals tomorrow.  Today it is better to start with liquids and gradually work up to solid foods.  You may eat anything you prefer, but it is better to start with liquids, then soup and crackers, and gradually work up to solid foods.   3) Please notify your doctor immediately if you have any unusual bleeding, trouble breathing, redness and pain at the surgery site, drainage, fever, or pain not relieved by medication.    4) Additional Instructions:   Keep green arm band on for 4 days  Please contact your physician with any problems or Same Day Surgery at [phone], Monday through Friday 6 am to 4 pm, or [place] at [facility] number at [phone].    Robotic Assisted Laparoscopic Vaginal Hysterectomy, Care After The following information offers guidance on how to care for yourself after your procedure. Your health care provider may also give you more specific instructions. If you have problems or questions, contact your health care provider. What can I expect after the procedure? After the procedure, it is common to have:  Soreness and numbness in your incision areas.  Abdominal pain. You will be given pain medicine to control it.  Vaginal bleeding and discharge. You will need to use a sanitary pad after this procedure.  Tiredness (fatigue).  Poor appetite.  Less interest in sex.  Feelings of sadness or other emotions. If your ovaries were also removed, it is common to have symptoms of menopause, such as hot flashes, night sweats, and lack of sleep (insomnia). Follow these instructions at home: Medicines  Take over-the-counter and prescription medicines only as told by your health care  provider.  Do not take aspirin or NSAIDs, such as ibuprofen. These medicines can cause bleeding.  Ask your health care provider if the medicine prescribed to you: ? Requires you to avoid driving or using heavy machinery. ? Can cause constipation. You may need to take these actions to prevent or treat constipation:  Drink enough fluid to keep your urine pale yellow.  Take over-the-counter or prescription medicines.  Eat foods that are high in fiber, such as beans, whole grains, and fresh fruits and vegetables.  Limit foods that are high in fat and processed sugars, such as fried or sweet foods. Incision care  Follow instructions from your health care provider about how to take care of your incisions. Make sure you: ? Wash your hands with soap and water for at least 20 seconds before and after you change your bandage (dressing). If soap and water are not available, use hand sanitizer. ? Change your dressing as told by your health care provider. ? Leave stitches (sutures), skin glue, or adhesive strips in place. These skin closures may need to stay in place for 2 weeks or longer. If adhesive strip edges start to loosen and curl up, you may trim the loose edges. Do not remove adhesive strips completely unless your health care provider tells you to do that.  Check your incision areas every day for signs of infection. Check for: ? More redness, swelling, or pain. ? Fluid or blood. ? Warmth. ? Pus or a bad smell.   Activity  Rest as told by your healthcare  provider.  Return to your normal activities as told by your health care provider. Ask your health care provider what activities are safe for you.  Avoid sitting for a long time without moving. Get up to take short walks every 1-2 hours. This is important to improve blood flow and breathing. Ask for help if you feel weak or unsteady.  Do not lift anything that is heavier than 10 lb (4.5 kg), or the limit that you are told, until your  health care provider says that it is safe.  If you were given a sedative during the procedure, it can affect you for several hours. Do not drive or operate machinery until your health care provider says that it is safe.   Lifestyle  Do not use any products that contain nicotine or tobacco. These products include cigarettes, chewing tobacco, and vaping devices, such as e-cigarettes. These can delay healing after surgery. If you need help quitting, ask your health care provider.  Do not drink alcohol until your health care provider approves. General instructions  Do not douche, use tampons, or have sex for at least 6 weeks, or as told by your health care provider.  If you struggle with physical or emotional changes after your procedure, speak with your health care provider or a therapist.  Do not take baths, swim, or use a hot tub until your health care provider approves. You may only be allowed to take showers for 2-3 weeks.  Keep your dressing dry until your health care provider says it can be removed.  Try to have someone at home with you for the first 1-2 weeks to help with your daily chores.  Wear compression stockings as told by your health care provider. These stockings help to prevent blood clots and reduce swelling in your legs.  Keep all follow-up visits. This is important.   Contact a health care provider if:  You have any of these signs of infection: ? More redness, swelling, warmth, or pain around an incision. ? Fluid or blood coming from an incision. ? Pus or a bad smell coming from an incision. ? Chills or a fever.  An incision opens.  Your pain medicine is not helping.  You feel dizzy or light-headed.  You have pain or bleeding when you urinate.  You have nausea and vomiting that does not go away.  You have pus, or a bad-smelling discharge coming from your vagina. Get help right away if:  You have a fever and your symptoms suddenly get worse.  You have  severe abdominal pain.  You have chest pain.  You have shortness of breath.  You faint.  You have pain, swelling, or redness in your leg.  You have heavy vaginal bleeding and blood clots, soaking through a sanitary pad in less than 1 hour. These symptoms may represent a serious problem that is an emergency. Do not wait to see if the symptoms will go away. Get medical help right away. Call your local emergency services (911 in the [place] Do not drive yourself to the hospital. Summary  After the procedure, it is common to have abdominal pain and vaginal bleeding.  Wear a sanitary pad for vaginal discharge or bleeding.  You should not drive or lift heavy objects until your health care provider says that it is safe.  Contact your health care provider if you have any symptoms of infection, heavy vaginal bleeding, nausea, vomiting, or shortness of breath. This information is not intended to replace advice  given to you by your health care provider. Make sure you discuss any questions you have with your health care provider. Document Revised: 10/19/2019 Document Reviewed: 10/19/2019 Elsevier Patient Education  Uncertain.  Bupivacaine Liposomal Suspension for Injection What is this medicine? BUPIVACAINE LIPOSOMAL (bue PIV a kane LIP oh som al) is an anesthetic. It causes loss of feeling in the skin or other tissues. It is used to prevent and to treat pain from some procedures. This medicine may be used for other purposes; ask your health care provider or pharmacist if you have questions. COMMON BRAND NAME(S): EXPAREL What should I tell my health care provider before I take this medicine? They need to know if you have any of these conditions:  G6PD deficiency  heart disease  kidney disease  liver disease  low blood pressure  lung or breathing disease, like asthma  an unusual or allergic reaction to bupivacaine, other medicines, foods, dyes, or preservatives  pregnant or  trying to get pregnant  breast-feeding How should I use this medicine? This medicine is injected into the affected area. It is given by a health care provider in a hospital or clinic setting. Talk to your health care provider about the use of this medicine in children. While it may be given to children as young as 6 years for selected conditions, precautions do apply. Overdosage: If you think you have taken too much of this medicine contact a poison control center or emergency room at once. NOTE: This medicine is only for you. Do not share this medicine with others. What if I miss a dose? This does not apply. What may interact with this medicine? This medicine may interact with the following medications:  acetaminophen  certain antibiotics like dapsone, nitrofurantoin, aminosalicylic acid, sulfonamides  certain medicines for seizures like phenobarbital, phenytoin, valproic acid  chloroquine  cyclophosphamide  flutamide  hydroxyurea  ifosfamide  metoclopramide  nitric oxide  nitroglycerin  nitroprusside  nitrous oxide  other local anesthetics like lidocaine, pramoxine, tetracaine  primaquine  quinine  rasburicase  sulfasalazine This list may not describe all possible interactions. Give your health care provider a list of all the medicines, herbs, non-prescription drugs, or dietary supplements you use. Also tell them if you smoke, drink alcohol, or use illegal drugs. Some items may interact with your medicine. What should I watch for while using this medicine? Your condition will be monitored carefully while you are receiving this medicine. Be careful to avoid injury while the area is numb, and you are not aware of pain. What side effects may I notice from receiving this medicine? Side effects that you should report to your doctor or health care professional as soon as possible:  allergic reactions like skin rash, itching or hives, swelling of the face, lips, or  tongue  seizures  signs and symptoms of a dangerous change in heartbeat or heart rhythm like chest pain; dizziness; fast, irregular heartbeat; palpitations; feeling faint or lightheaded; falls; breathing problems  signs and symptoms of methemoglobinemia such as pale, gray, or blue colored skin; headache; fast heartbeat; shortness of breath; feeling faint or lightheaded, falls; tiredness Side effects that usually do not require medical attention (report to your doctor or health care professional if they continue or are bothersome):  anxious  back pain  changes in taste  changes in vision  constipation  dizziness  fever  nausea, vomiting This list may not describe all possible side effects. Call your doctor for medical advice about side effects.  You may report side effects to FDA at 1-800-FDA-1088. Where should I keep my medicine? This drug is given in a hospital or clinic and will not be stored at home. NOTE: This sheet is a summary. It may not cover all possible information. If you have questions about this medicine, talk to your doctor, pharmacist, or health care provider.  2021 Elsevier/Gold Standard (2019-05-24 12:24:57)

## 2020-06-25 ENCOUNTER — Encounter: Payer: Self-pay | Admitting: Obstetrics and Gynecology

## 2020-06-26 LAB — SURGICAL PATHOLOGY

## 2020-07-01 ENCOUNTER — Other Ambulatory Visit: Payer: Self-pay

## 2020-07-01 ENCOUNTER — Encounter: Payer: Self-pay | Admitting: Obstetrics and Gynecology

## 2020-07-01 ENCOUNTER — Ambulatory Visit (INDEPENDENT_AMBULATORY_CARE_PROVIDER_SITE_OTHER): Payer: Medicaid Other | Admitting: Obstetrics and Gynecology

## 2020-07-01 VITALS — BP 110/70 | Ht 61.0 in | Wt 160.0 lb

## 2020-07-01 DIAGNOSIS — Z90710 Acquired absence of both cervix and uterus: Secondary | ICD-10-CM

## 2020-07-01 DIAGNOSIS — Z4889 Encounter for other specified surgical aftercare: Secondary | ICD-10-CM

## 2020-07-01 NOTE — Progress Notes (Signed)
  Postoperative Follow-up Patient presents post op from  [facility] Robot-assisted Laparoscopic Hysterectomy and Bilateral Salpingectomy  for  Pelvic pain and menorrhagia , 1 week ago.  Subjective: Patient reports marked improvement in her preop symptoms. Eating a regular diet without difficulty. Pain is controlled without any medications.  Activity: normal activities of daily living. Patient reports additional symptom's since surgery of None.  Objective: BP 110/70   Ht 5\' 1"  (1.549 m)   Wt 160 lb (72.6 kg)   LMP 05/27/2020 (Exact Date)   BMI 30.23 kg/m  Physical Exam Constitutional:      Appearance: Normal appearance. She is well-developed.  HENT:     Head: Normocephalic and atraumatic.  Eyes:     Extraocular Movements: Extraocular movements intact.     Pupils: Pupils are equal, round, and reactive to light.  Neck:     Thyroid: No thyromegaly.  Cardiovascular:     Rate and Rhythm: Normal rate and regular rhythm.     Heart sounds: Normal heart sounds.  Pulmonary:     Effort: Pulmonary effort is normal.     Breath sounds: Normal breath sounds.  Abdominal:     General: Bowel sounds are normal. There is no distension.     Palpations: Abdomen is soft. There is no mass.     Comments: Incisions clean, dry and intact  Musculoskeletal:     Cervical back: Neck supple.  Neurological:     Mental Status: She is alert and oriented to person, place, and time.  Skin:    General: Skin is warm and dry.  Psychiatric:        Behavior: Behavior normal.        Thought Content: Thought content normal.        Judgment: Judgment normal.  Vitals reviewed.     Assessment: s/p :  [facility] Robot-assisted Laparoscopic Hysterectomy and Bilateral Salpingectomy  stable  Plan: Patient has done well after surgery with no apparent complications.  I have discussed the post-operative course to date, and the expected progress moving forward.  The patient understands what complications to be concerned about.  I will  see the patient in routine follow up, or sooner if needed.    Activity plan: No heavy lifting. Pelvic rest.  Adrian Prows MD, [place], [place] 07/01/2020 3:33 PM

## 2020-07-01 NOTE — Patient Instructions (Signed)
Laparoscopically Assisted Vaginal Hysterectomy, Care After The following information offers guidance on how to care for yourself after your procedure. Your health care provider may also give you more specific instructions. If you have problems or questions, contact your health care provider. What can I expect after the procedure? After the procedure, it is common to have:  Soreness and numbness in your incision areas.  Abdominal pain. You will be given pain medicine to control it.  Vaginal bleeding and discharge. You will need to use a sanitary pad after this procedure.  Tiredness (fatigue).  Poor appetite.  Less interest in sex.  Feelings of sadness or other emotions. If your ovaries were also removed, it is common to have symptoms of menopause, such as hot flashes, night sweats, and lack of sleep (insomnia). Follow these instructions at home: Medicines  Take over-the-counter and prescription medicines only as told by your health care provider.  Do not take aspirin or NSAIDs, such as ibuprofen. These medicines can cause bleeding.  Ask your health care provider if the medicine prescribed to you: ? Requires you to avoid driving or using heavy machinery. ? Can cause constipation. You may need to take these actions to prevent or treat constipation:  Drink enough fluid to keep your urine pale yellow.  Take over-the-counter or prescription medicines.  Eat foods that are high in fiber, such as beans, whole grains, and fresh fruits and vegetables.  Limit foods that are high in fat and processed sugars, such as fried or sweet foods. Incision care  Follow instructions from your health care provider about how to take care of your incisions. Make sure you: ? Wash your hands with soap and water for at least 20 seconds before and after you change your bandage (dressing). If soap and water are not available, use hand sanitizer. ? Change your dressing as told by your health care  provider. ? Leave stitches (sutures), skin glue, or adhesive strips in place. These skin closures may need to stay in place for 2 weeks or longer. If adhesive strip edges start to loosen and curl up, you may trim the loose edges. Do not remove adhesive strips completely unless your health care provider tells you to do that.  Check your incision areas every day for signs of infection. Check for: ? More redness, swelling, or pain. ? Fluid or blood. ? Warmth. ? Pus or a bad smell.   Activity  Rest as told by your healthcare provider.  Return to your normal activities as told by your health care provider. Ask your health care provider what activities are safe for you.  Avoid sitting for a long time without moving. Get up to take short walks every 1-2 hours. This is important to improve blood flow and breathing. Ask for help if you feel weak or unsteady.  Do not lift anything that is heavier than 10 lb (4.5 kg), or the limit that you are told, until your health care provider says that it is safe.  If you were given a sedative during the procedure, it can affect you for several hours. Do not drive or operate machinery until your health care provider says that it is safe.   Lifestyle  Do not use any products that contain nicotine or tobacco. These products include cigarettes, chewing tobacco, and vaping devices, such as e-cigarettes. These can delay healing after surgery. If you need help quitting, ask your health care provider.  Do not drink alcohol until your health care provider approves. General  instructions  Do not douche, use tampons, or have sex for at least 6 weeks, or as told by your health care provider.  If you struggle with physical or emotional changes after your procedure, speak with your health care provider or a therapist.  Do not take baths, swim, or use a hot tub until your health care provider approves. You may only be allowed to take showers for 2-3 weeks.  Keep your  dressing dry until your health care provider says it can be removed.  Try to have someone at home with you for the first 1-2 weeks to help with your daily chores.  Wear compression stockings as told by your health care provider. These stockings help to prevent blood clots and reduce swelling in your legs.  Keep all follow-up visits. This is important.   Contact a health care provider if:  You have any of these signs of infection: ? More redness, swelling, warmth, or pain around an incision. ? Fluid or blood coming from an incision. ? Pus or a bad smell coming from an incision. ? Chills or a fever.  An incision opens.  Your pain medicine is not helping.  You feel dizzy or light-headed.  You have pain or bleeding when you urinate.  You have nausea and vomiting that does not go away.  You have pus, or a bad-smelling discharge coming from your vagina. Get help right away if:  You have a fever and your symptoms suddenly get worse.  You have severe abdominal pain.  You have chest pain.  You have shortness of breath.  You faint.  You have pain, swelling, or redness in your leg.  You have heavy vaginal bleeding and blood clots, soaking through a sanitary pad in less than 1 hour. These symptoms may represent a serious problem that is an emergency. Do not wait to see if the symptoms will go away. Get medical help right away. Call your local emergency services (911 in the [place] Do not drive yourself to the hospital. Summary  After the procedure, it is common to have abdominal pain and vaginal bleeding.  Wear a sanitary pad for vaginal discharge or bleeding.  You should not drive or lift heavy objects until your health care provider says that it is safe.  Contact your health care provider if you have any symptoms of infection, heavy vaginal bleeding, nausea, vomiting, or shortness of breath. This information is not intended to replace advice given to you by your health care  provider. Make sure you discuss any questions you have with your health care provider. Document Revised: 10/19/2019 Document Reviewed: 10/19/2019 Elsevier Patient Education  2021 Elsevier Inc. Hysterectomy Information  A hysterectomy is a surgery in which the uterus is removed. The lowest part of the uterus (cervix), which opens into the vagina, may be removed as well. In some cases, the fallopian tubes, the ovaries,  or both the fallopian tubes and the ovaries may also be removed. This procedure may be done to treat different medical problems. It may also be done to help transgender men feel more masculine. After the procedure, a woman will no longer have menstrual periods and will not be able to become pregnant (sterile). What are the reasons for a hysterectomy? There are many reasons why a person might have this procedure. They include:  Persistent, abnormal vaginal bleeding.  Long-term (chronic) pelvic pain or infection.  Endometriosis. This is when the lining of the uterus (endometrium) starts to grow outside the uterus.    Adenomyosis. This is when the endometrium starts to grow in the muscle of the uterus.  Pelvic organ prolapse. This is a condition in which the uterus falls down into the vagina.  Noncancerous growths in the uterus (uterine fibroids) that cause symptoms.  The presence of precancerous cells.  Cervical or uterine cancer.  Sex change. This helps a transgender man complete his female identity. What are the different types of hysterectomy? There are three different types of hysterectomy:  Supracervical hysterectomy. In this type, the top part of the uterus is removed, but not the cervix.  Total hysterectomy. In this type, the uterus and cervix are removed.  Radical hysterectomy. In this type, the uterus, the cervix, and the tissue that holds the uterus in place (parametrium) are removed. What are the different ways a hysterectomy can be performed? There are many  different ways a hysterectomy can be performed, including:  Abdominal hysterectomy. In this type, an incision is made in the abdomen. The uterus is removed through this incision.  Vaginal hysterectomy. In this type, an incision is made in the vagina. The uterus is removed through this incision. There are no abdominal incisions.  Conventional laparoscopic hysterectomy. In this type, 3 or 4 small incisions are made in the abdomen. A thin, lighted tube with a camera (laparoscope) is inserted into one of the incisions. Other tools are put through the other incisions. The uterus is cut into small pieces. The small pieces are removed through the incisions or the vagina.  Laparoscopically assisted vaginal hysterectomy (LAVH). In this type, 3 or 4 small incisions are made in the abdomen. Part of the surgery is performed laparoscopically and the other part is done vaginally. The uterus is removed through the vagina.  Robot-assisted laparoscopic hysterectomy. In this type, a laparoscope and other tools are inserted into 3 or 4 small incisions in the abdomen. A computer-controlled device is used to give the surgeon a 3D image and to help control the surgical instruments. This allows for more precise movements of surgical instruments. The uterus is cut into small pieces and removed through the incisions or the vagina. Discuss the options with your health care provider to determine which type is the right one for you. What are the risks of this surgery? Generally, this is a safe procedure. However, problems may occur, including:  Bleeding and risk of blood transfusion. Tell your health care provider if you do not want to receive any blood products.  Blood clots in the legs or lung.  Infection.  Damage to nearby structures or organs.  Allergic reactions to medicines.  Having to change to an abdominal hysterectomy after starting a less invasive technique. What to expect after a hysterectomy  You will be  given pain medicine.  You may need to stay in the hospital for 1-2 days to recover, depending on the type of hysterectomy you had.  You will need to have someone with you for the first 3-5 days after you go home.  You will need to follow up with your surgeon in 2-4 weeks after surgery to evaluate your progress.  If the ovaries are removed, you will have early menopause symptoms such as hot flashes, night sweats, and insomnia. If you had a hysterectomy for a problem that was not cancer or a condition that could not lead to cancer, then you no longer need Pap tests. However, even if you no longer need a Pap test, get regular pelvic exams to make sure no other problems are developing.   Questions to ask your health care provider  Is a hysterectomy medically necessary? Do I have other treatment options for my condition?  What are my options for hysterectomy procedure?  What organs and tissues need to be removed?  What are the risks?  What are the benefits?  How long will I need to stay in the hospital after the procedure?  How long will I need to recover at home?  What symptoms can I expect after the procedure? Summary  A hysterectomy is a surgery in which the uterus is removed. The fallopian tubes, the ovaries, or both may be removed as well.  This procedure may be done to treat different medical problems. It may also be done to complete your female identity during a sex change.  After the procedure, a woman will no longer have a menstrual period and will not be able to become pregnant.  There are three types of hysterectomy. Discuss with your health care provider which options are right for you.  This is a safe procedure, though there are some risks. Risks include infection, bleeding, blood clots, or damage to nearby organs. This information is not intended to replace advice given to you by your health care provider. Make sure you discuss any questions you have with your health care  provider. Document Revised: 12/15/2018 Document Reviewed: 12/15/2018 Elsevier Patient Education  2021 Elsevier Inc.  

## 2020-07-31 ENCOUNTER — Ambulatory Visit: Payer: Medicaid Other | Admitting: Obstetrics and Gynecology

## 2020-08-26 DIAGNOSIS — F332 Major depressive disorder, recurrent severe without psychotic features: Secondary | ICD-10-CM | POA: Diagnosis not present

## 2020-08-26 DIAGNOSIS — F900 Attention-deficit hyperactivity disorder, predominantly inattentive type: Secondary | ICD-10-CM | POA: Diagnosis not present

## 2020-08-26 DIAGNOSIS — F411 Generalized anxiety disorder: Secondary | ICD-10-CM | POA: Diagnosis not present

## 2020-08-26 DIAGNOSIS — F4312 Post-traumatic stress disorder, chronic: Secondary | ICD-10-CM | POA: Diagnosis not present

## 2020-08-26 DIAGNOSIS — F410 Panic disorder [episodic paroxysmal anxiety] without agoraphobia: Secondary | ICD-10-CM | POA: Diagnosis not present

## 2021-02-05 ENCOUNTER — Telehealth: Payer: Self-pay

## 2021-02-05 NOTE — Telephone Encounter (Signed)
Copied from [place] [id number]. Topic: Appointment Scheduling - Scheduling Inquiry for Clinic >> Feb 05, 2021  3:22 PM Greggory Keen D wrote: Pt called asking to schedule an appt for migraines and to stop smoking  CB# [phone]

## 2021-02-06 ENCOUNTER — Encounter: Payer: Self-pay | Admitting: Physician Assistant

## 2021-02-06 ENCOUNTER — Other Ambulatory Visit: Payer: Self-pay

## 2021-02-06 ENCOUNTER — Ambulatory Visit: Payer: Medicaid Other | Admitting: Physician Assistant

## 2021-02-06 VITALS — BP 135/99 | HR 92 | Temp 98.3°F | Resp 16 | Ht 61.0 in | Wt 168.0 lb

## 2021-02-06 DIAGNOSIS — G43111 Migraine with aura, intractable, with status migrainosus: Secondary | ICD-10-CM | POA: Diagnosis not present

## 2021-02-06 DIAGNOSIS — Z720 Tobacco use: Secondary | ICD-10-CM | POA: Insufficient documentation

## 2021-02-06 DIAGNOSIS — G479 Sleep disorder, unspecified: Secondary | ICD-10-CM

## 2021-02-06 MED ORDER — SUMATRIPTAN SUCCINATE 50 MG PO TABS: 50.0000 mg | ORAL_TABLET | ORAL | 1 refills | Status: DC | PRN

## 2021-02-06 NOTE — Assessment & Plan Note (Signed)
Patient has been an intermittent smoker for the past 15 years. Currently 1ppd  Discussed gradual reduction in daily cigarette use by 1/4 to 1/2 per week  Once she has completely discontinued smoking may use Lozenge for nicotine replacement Follow up in 2 months to discuss progress   May consider adding Varenicline to assist with cessation once she has stabilized her mood (depression/ anxiety ) with psychiatry due to concerns of increasing SI and destabilizing mood further.

## 2021-02-06 NOTE — Patient Instructions (Addendum)
Smoking cessation hotline 1800QuitNow   Smoking Cessation Try to gradually reduce your daily cigarette use but 1/4 to 1/2 per week  Once you have completely stopped try to use oral lozenges to help with cravings.  We can revisit adding a medication at the next follow up after you've followed up with psychiatrist for mental health concerns.   Mental Health Resources Struggle Care -online resource  Please bring in your Disability paperwork to both this office and your psychiatrist to assist with coordination of completion.   In the past three months, have you smoked cigarettes or any other form of tobacco (cigars, black and mild, hookah, other) or chewed/used smokeless tobacco? If yes, consider: 1.  Almost all adult tobacco users start when they are teens. 2.  While tobacco use rates have been declining among youth over the past 20 years, almost a quarter of teens still use tobacco. 3. Tobacco-using teens are more likely to use other substances, become involved in violent behavior, be sexually active, and are at a higher risk for depression and suicide. Local Resources: [place] Quitline https://www.young.biz/ 1-800-QUIT-NOW  [facility] Tobacco Prevention and Control Mary Gillett mgillet@co .[place] 975-883-2549    Online Resources: American Cancer Society  http://www.cancer.[place]  http://hunter.com/  Centers for Disease Control and Prevention RegulatorBlog.com.cy   Mental Health Apps and Websites Here are a few free apps meant to help you to help yourself.  To find, try searching on the internet to see if the app is offered on Apple/Android devices. If your first choice doesn't come up on your device, the good news is that there are many choices! Play around with different apps to see which ones are helpful to you . Calm This is an app meant to help increase calm feelings.  Includes info, strategies, and tools for tracking your feelings.   Calm Harm  This app is meant to help with self-harm. Provides many 5-minute or 15-min coping strategies for doing instead of hurting yourself.    [place] is a problem-solving tool to help deal with emotions and cope with stress you encounter wherever you are.    MindShift This app can help people cope with anxiety. Rather than trying to avoid anxiety, you can make an important shift and face it.    MY3  MY3 features a support system, safety plan and resources with the goal of offering a tool to use in a time of need.    My Life My Voice  This mood journal offers a simple solution for tracking your thoughts, feelings and moods. Animated emoticons can help identify your mood.   Relax Melodies Designed to help with sleep, on this app you can mix sounds and meditations for relaxation.    Smiling Mind Smiling Mind is meditation made easy: it's a simple tool that helps put a smile on your mind.    Stop, Breathe & Think  A friendly, simple guide for people through meditations for mindfulness and compassion.  Stop, Breathe and Think Kids Enter your current feelings and choose a "mission" to help you cope. Offers videos for certain moods instead of just sound recordings.     The [place] The [place] Box (VHB) contains simple tools to help patients with coping, relaxation, distraction, and positive thinking.

## 2021-02-06 NOTE — Progress Notes (Signed)
Established Patient Office Visit  Subjective:  Patient ID: Joyce Swanson, female    DOB: 1990/05/05  Age: 30 y.o. MRN: [id number]  CC:  Chief Complaint  Patient presents with   Migraine   Nicotine Dependence    HPI Joyce Swanson presents for headaches. Patient states she has had these symptoms for years. She gets a burning type pain in the top of her head, that spreads downward. Patient states pain occurs daily and last for hours. Patient states in the past 2 weeks symptoms have worsened, lasting up to 12 hours. Associates symptoms with her anxiety. Sees psychiatry.   Duration of headaches: Previous headaches would last for several days but over past two weeks they have been nonstop.  Aura present: yes - burning sensation when exposed to bright lights and sounds can induce panic attacks. Nausea: no Associated headache symptoms: burning sensation, fatigue States she is experiencing a "brain burning" sensation that starts at the back of the head and spreads to the rest of her head. Worsens with depression.  Reports generalized pain and fatigue Denies true weakness, paralysis, or numbness of extremities.  She has tried tylenol, ibuprofen OTC but these have not provided relief.  States headaches are barrier to normal day to day activities and can herald a depressive episode.     Smoking Cessation Current habits: 1 pack daily Smoking History: on and off since age 80 Previous cessation measures: Has tried Wellbutrin for depression but this did not work and increased desire for smoking.  She has tried gum but cannot chew gum due to dentition and patch makes her itch.  Is amenable to nicotine lozenges.  Discussed attempting to reduce to half pack per day for now and will revisit lozenges after she has completely stopped smoking.   Associated psych concerns States it has been several months since she took any psych medications in order to give her a break and allow mood to stabilize  before trying other medications.  She was on Duloxetine PTSD: Prazosin was discontinued due to allergy Anxiety: Depression and suicidal thoughts:  Has a good support system at home for depression.    OSA: Patient states her sister has informed her that she stops breathing while sleeping. Patient reports she wakes up panicked frequently with increased heart rate.   Depression screen [facility] 2/9 02/06/2021 05/06/2020 04/22/2020  Decreased Interest 2 3 0  Down, Depressed, Hopeless 1 3 3   PHQ - 2 Score 3 6 3   Altered sleeping 1 3 3   Tired, decreased energy 3 3 3   Change in appetite 3 3 3   Feeling bad or failure about yourself  2 3 3   Trouble concentrating 3 3 3   Moving slowly or fidgety/restless 3 3 3   Suicidal thoughts 0 0 3  PHQ-9 Score 18 24 24   Difficult doing work/chores Extremely dIfficult Extremely dIfficult Extremely dIfficult   GAD 7 : Generalized Anxiety Score 02/06/2021 05/06/2020 04/22/2020 09/11/2019  Nervous, Anxious, on Edge 3 3 3 3   Control/stop worrying 3 3 3 3   Worry too much - different things 3 3 3 3   Trouble relaxing 3 3 3 3   Restless 3 3 3 3   Easily annoyed or irritable 3 3 3 3   Afraid - awful might happen 2 3 3 3   Total GAD 7 Score 20 21 21 21   Anxiety Difficulty Extremely difficult Extremely difficult Extremely difficult Somewhat difficult      Past Medical History:  Diagnosis Date   Anemia    Anxiety  Asthma    as a child   Bronchitis 04/2020   Depression    Fibroid, uterine    Gestational diabetes    Liver mass    Pneumonia 2021   Sleep apnea     Past Surgical History:  Procedure Laterality Date   CESAREAN SECTION  2013, 2015   CYSTOSCOPY N/A 06/24/2020   Procedure: CYSTOSCOPY;  Surgeon: Homero Fellers, MD;  Location: [facility] ORS;  Service: Gynecology;  Laterality: N/A;   ROBOTIC ASSISTED LAPAROSCOPIC HYSTERECTOMY AND SALPINGECTOMY Bilateral 06/24/2020   Procedure: XI ROBOTIC ASSISTED LAPAROSCOPIC HYSTERECTOMY AND BILATERAL SALPINGECTOMY;   Surgeon: Homero Fellers, MD;  Location: [facility] OR[facility];  Service: Gynecology;  Laterality: Bilateral;   TUBAL LIGATION      Family History  Problem Relation Age of Onset   Diabetes Father    Diabetes Paternal Aunt    Diabetes Paternal Uncle    Diabetes Paternal Grandmother     Social History   Socioeconomic History   Marital status: Divorced    Spouse name: Not on file   Number of children: 3   Years of education: Not on file   Highest education level: Not on file  Occupational History   Not on file  Tobacco Use   Smoking status: Every Day    Packs/day: 1.00    Types: Cigarettes   Smokeless tobacco: Never  Vaping Use   Vaping Use: Never used  Substance and Sexual Activity   Alcohol use: Yes    Comment: occassional   Drug use: Not Currently    Types: Marijuana    Comment: as a teenager   Sexual activity: Yes    Birth control/protection: Surgical  Other Topics Concern   Not on file  Social History Narrative   Not on file   Social Determinants of Health   Financial Resource Strain: Not on file  Food Insecurity: Not on file  Transportation Needs: Not on file  Physical Activity: Not on file  Stress: Not on file  Social Connections: Not on file  Intimate Partner Violence: Not on file    Outpatient Medications Prior to Visit  Medication Sig Dispense Refill   albuterol (VENTOLIN HFA) 108 (90 Base) MCG/ACT inhaler Inhale 1-2 puffs into the lungs every 6 (six) hours as needed for wheezing or shortness of breath.     acetaminophen (TYLENOL) 500 MG tablet Take 1 tablet (500 mg total) by mouth every 6 (six) hours. (Patient not taking: Reported on 02/06/2021) 60 tablet 0   docusate sodium (COLACE) 100 MG capsule Take 1 capsule (100 mg total) by mouth 2 (two) times daily as needed. (Patient not taking: Reported on 02/06/2021) 30 capsule 0   DULoxetine (CYMBALTA) 20 MG capsule Take 20 mg by mouth daily. (Patient not taking: Reported on 02/06/2021)     ibuprofen (ADVIL) 600 MG  tablet Take 1 tablet (600 mg total) by mouth every 6 (six) hours as needed. (Patient not taking: Reported on 02/06/2021) 60 tablet 0   nicotine (NICODERM CQ - DOSED IN MG/24 HOURS) 21 mg/24hr patch Place 1 patch (21 mg total) onto the skin daily. (Patient not taking: Reported on 02/06/2021) 30 patch 0   oxyCODONE (ROXICODONE) 5 MG immediate release tablet Take 1 tablet (5 mg total) by mouth every 6 (six) hours as needed for severe pain. (Patient not taking: Reported on 07/01/2020) 20 tablet 0   pantoprazole (PROTONIX) 40 MG tablet Take 40 mg by mouth 2 (two) times daily. (Patient not taking: Reported on 02/06/2021)  propranolol (INDERAL) 10 MG tablet Take 10 mg by mouth every 6 (six) hours as needed for anxiety. (Patient not taking: Reported on 02/06/2021)     No facility-administered medications prior to visit.    Allergies  Allergen Reactions   Prazosin Palpitations    ROS Review of Systems  Constitutional:  Negative for appetite change, chills, fatigue and fever.  Respiratory:  Negative for chest tightness and shortness of breath.   Cardiovascular:  Negative for chest pain and palpitations.  Gastrointestinal:  Negative for abdominal pain, nausea and vomiting.  Neurological:  Positive for headaches. Negative for dizziness and weakness.     Objective:    Physical Exam HENT:     Head: Normocephalic.  Eyes:     Extraocular Movements: Extraocular movements intact.     Conjunctiva/sclera: Conjunctivae normal.     Pupils: Pupils are equal, round, and reactive to light.  Cardiovascular:     Rate and Rhythm: Normal rate and regular rhythm.     Pulses: Normal pulses.     Heart sounds: Normal heart sounds.  Pulmonary:     Effort: Pulmonary effort is normal.     Breath sounds: Normal breath sounds.  Musculoskeletal:     Right lower leg: No edema.     Left lower leg: No edema.  Skin:    General: Skin is warm.  Neurological:     General: No focal deficit present.     Mental Status:  She is alert and oriented to person, place, and time.     Motor: Motor function is intact. No weakness or tremor.  Psychiatric:        Mood and Affect: Mood normal.        Behavior: Behavior normal.        Thought Content: Thought content normal.        Judgment: Judgment normal.    BP (!) 135/99 (BP Location: Right Arm, Patient Position: Sitting, Cuff Size: Large)   Pulse 92   Temp 98.3 F (36.8 C) (Temporal)   Resp 16   Ht 5\' 1"  (1.549 m)   Wt 168 lb (76.2 kg)   LMP 05/27/2020 (Exact Date)   SpO2 97%   BMI 31.74 kg/m  Wt Readings from Last 3 Encounters:  02/06/21 168 lb (76.2 kg)  07/01/20 160 lb (72.6 kg)  06/24/20 162 lb (73.5 kg)     Health Maintenance Due  Topic Date Due   COVID-19 Vaccine (1) Never done   Pneumococcal Vaccine 72-3 Years old (1 - PCV) Never done   TETANUS/TDAP  Never done   INFLUENZA VACCINE  Never done    There are no preventive care reminders to display for this patient.  Lab Results  Component Value Date   TSH 1.050 04/22/2020   Lab Results  Component Value Date   WBC 10.4 06/20/2020   HGB 13.2 06/20/2020   HCT 39.2 06/20/2020   MCV 89.3 06/20/2020   PLT 337 06/20/2020   Lab Results  Component Value Date   NA 136 06/20/2020   K 4.1 06/20/2020   CO2 24 06/20/2020   GLUCOSE 101 (H) 06/20/2020   BUN 15 06/20/2020   CREATININE 0.63 06/20/2020   BILITOT 0.8 05/28/2019   ALKPHOS 64 05/28/2019   AST 37 05/28/2019   ALT 66 (H) 05/28/2019   PROT 7.8 05/28/2019   ALBUMIN 4.4 05/28/2019   CALCIUM 9.3 06/20/2020   ANIONGAP 7 06/20/2020   No results found for: CHOL No results found for: HDL No  results found for: LDLCALC No results found for: TRIG No results found for: CHOLHDL No results found for: HGBA1C    Assessment & Plan:   Problem List Items Addressed This Visit       Cardiovascular and Mediastinum   Intractable migraine with aura with status migrainosus - Primary    Patient presents with status migrainosus today that  has been intractable with OTC home remedy for past 2 weeks Likely somatic associated with depression per patient report Provided Sumatriptan for symptom management Followup in 2 months.        Relevant Medications   SUMAtriptan (IMITREX) 50 MG tablet     Other   Smoking trying to quit    Patient has been an intermittent smoker for the past 15 years. Currently 1ppd  Discussed gradual reduction in daily cigarette use by 1/4 to 1/2 per week  Once she has completely discontinued smoking may use Lozenge for nicotine replacement Follow up in 2 months to discuss progress   May consider adding Varenicline to assist with cessation once she has stabilized her mood (depression/ anxiety ) with psychiatry due to concerns of increasing SI and destabilizing mood further.       Sleep difficulties    Patient reports that she awakens frequently at night in panic and her sister states she stops breathing often while asleep.  Ambulatory referral to sleep services provided for concerns for sleep apnea.       Relevant Orders   Ambulatory referral to Sleep Studies   Problem List Items Addressed This Visit       Cardiovascular and Mediastinum   Intractable migraine with aura with status migrainosus - Primary    Patient presents with status migrainosus today that has been intractable with OTC home remedy for past 2 weeks Likely somatic associated with depression per patient report Provided Sumatriptan for symptom management Followup in 2 months.        Relevant Medications   SUMAtriptan (IMITREX) 50 MG tablet     Other   Smoking trying to quit    Patient has been an intermittent smoker for the past 15 years. Currently 1ppd  Discussed gradual reduction in daily cigarette use by 1/4 to 1/2 per week  Once she has completely discontinued smoking may use Lozenge for nicotine replacement Follow up in 2 months to discuss progress   May consider adding Varenicline to assist with cessation once she has  stabilized her mood (depression/ anxiety ) with psychiatry due to concerns of increasing SI and destabilizing mood further.       Sleep difficulties    Patient reports that she awakens frequently at night in panic and her sister states she stops breathing often while asleep.  Ambulatory referral to sleep services provided for concerns for sleep apnea.       Relevant Orders   Ambulatory referral to Sleep Studies     Return in about 2 months (around 04/09/2021) for smoking cessation, migraines, disability paperwork. Cephus Slater Tiarra Anastacio, PA-C, have reviewed all documentation for this visit. The documentation on 02/06/21 for the exam, diagnosis, procedures, and orders are all accurate and complete.   Teagen Mcleary, [place]ie [place] MPH [facility]

## 2021-02-06 NOTE — Assessment & Plan Note (Signed)
Patient reports that she awakens frequently at night in panic and her sister states she stops breathing often while asleep.  Ambulatory referral to sleep services provided for concerns for sleep apnea.

## 2021-02-06 NOTE — Assessment & Plan Note (Signed)
Patient presents with status migrainosus today that has been intractable with OTC home remedy for past 2 weeks Likely somatic associated with depression per patient report Provided Sumatriptan for symptom management Followup in 2 months.

## 2021-03-05 ENCOUNTER — Institutional Professional Consult (permissible substitution): Payer: Medicaid Other | Admitting: Neurology

## 2021-03-06 ENCOUNTER — Encounter: Payer: Self-pay | Admitting: Neurology

## 2021-03-10 DIAGNOSIS — F429 Obsessive-compulsive disorder, unspecified: Secondary | ICD-10-CM | POA: Diagnosis not present

## 2021-03-10 DIAGNOSIS — F411 Generalized anxiety disorder: Secondary | ICD-10-CM | POA: Diagnosis not present

## 2021-03-10 DIAGNOSIS — F909 Attention-deficit hyperactivity disorder, unspecified type: Secondary | ICD-10-CM | POA: Diagnosis not present

## 2021-03-10 DIAGNOSIS — F4310 Post-traumatic stress disorder, unspecified: Secondary | ICD-10-CM | POA: Diagnosis not present

## 2021-03-19 DIAGNOSIS — F909 Attention-deficit hyperactivity disorder, unspecified type: Secondary | ICD-10-CM | POA: Diagnosis not present

## 2021-03-19 DIAGNOSIS — F429 Obsessive-compulsive disorder, unspecified: Secondary | ICD-10-CM | POA: Diagnosis not present

## 2021-03-19 DIAGNOSIS — F4310 Post-traumatic stress disorder, unspecified: Secondary | ICD-10-CM | POA: Diagnosis not present

## 2021-03-19 DIAGNOSIS — F411 Generalized anxiety disorder: Secondary | ICD-10-CM | POA: Diagnosis not present

## 2021-04-02 DIAGNOSIS — F4310 Post-traumatic stress disorder, unspecified: Secondary | ICD-10-CM | POA: Diagnosis not present

## 2021-04-02 DIAGNOSIS — F411 Generalized anxiety disorder: Secondary | ICD-10-CM | POA: Diagnosis not present

## 2021-04-02 DIAGNOSIS — F429 Obsessive-compulsive disorder, unspecified: Secondary | ICD-10-CM | POA: Diagnosis not present

## 2021-04-02 DIAGNOSIS — F909 Attention-deficit hyperactivity disorder, unspecified type: Secondary | ICD-10-CM | POA: Diagnosis not present

## 2021-04-10 DIAGNOSIS — F429 Obsessive-compulsive disorder, unspecified: Secondary | ICD-10-CM | POA: Diagnosis not present

## 2021-04-10 DIAGNOSIS — F909 Attention-deficit hyperactivity disorder, unspecified type: Secondary | ICD-10-CM | POA: Diagnosis not present

## 2021-04-10 DIAGNOSIS — F4310 Post-traumatic stress disorder, unspecified: Secondary | ICD-10-CM | POA: Diagnosis not present

## 2021-04-10 DIAGNOSIS — F411 Generalized anxiety disorder: Secondary | ICD-10-CM | POA: Diagnosis not present

## 2021-04-17 DIAGNOSIS — F909 Attention-deficit hyperactivity disorder, unspecified type: Secondary | ICD-10-CM | POA: Diagnosis not present

## 2021-04-17 DIAGNOSIS — F411 Generalized anxiety disorder: Secondary | ICD-10-CM | POA: Diagnosis not present

## 2021-04-17 DIAGNOSIS — F4310 Post-traumatic stress disorder, unspecified: Secondary | ICD-10-CM | POA: Diagnosis not present

## 2021-04-17 DIAGNOSIS — F429 Obsessive-compulsive disorder, unspecified: Secondary | ICD-10-CM | POA: Diagnosis not present

## 2021-04-23 DIAGNOSIS — F411 Generalized anxiety disorder: Secondary | ICD-10-CM | POA: Diagnosis not present

## 2021-04-23 DIAGNOSIS — F429 Obsessive-compulsive disorder, unspecified: Secondary | ICD-10-CM | POA: Diagnosis not present

## 2021-04-23 DIAGNOSIS — F4310 Post-traumatic stress disorder, unspecified: Secondary | ICD-10-CM | POA: Diagnosis not present

## 2021-04-23 DIAGNOSIS — F909 Attention-deficit hyperactivity disorder, unspecified type: Secondary | ICD-10-CM | POA: Diagnosis not present

## 2021-04-30 DIAGNOSIS — F909 Attention-deficit hyperactivity disorder, unspecified type: Secondary | ICD-10-CM | POA: Diagnosis not present

## 2021-04-30 DIAGNOSIS — F411 Generalized anxiety disorder: Secondary | ICD-10-CM | POA: Diagnosis not present

## 2021-04-30 DIAGNOSIS — F429 Obsessive-compulsive disorder, unspecified: Secondary | ICD-10-CM | POA: Diagnosis not present

## 2021-04-30 DIAGNOSIS — F4310 Post-traumatic stress disorder, unspecified: Secondary | ICD-10-CM | POA: Diagnosis not present

## 2021-05-14 DIAGNOSIS — F429 Obsessive-compulsive disorder, unspecified: Secondary | ICD-10-CM | POA: Diagnosis not present

## 2021-05-14 DIAGNOSIS — F411 Generalized anxiety disorder: Secondary | ICD-10-CM | POA: Diagnosis not present

## 2021-05-14 DIAGNOSIS — F4310 Post-traumatic stress disorder, unspecified: Secondary | ICD-10-CM | POA: Diagnosis not present

## 2021-05-14 DIAGNOSIS — F909 Attention-deficit hyperactivity disorder, unspecified type: Secondary | ICD-10-CM | POA: Diagnosis not present

## 2021-05-21 DIAGNOSIS — F429 Obsessive-compulsive disorder, unspecified: Secondary | ICD-10-CM | POA: Diagnosis not present

## 2021-05-21 DIAGNOSIS — F4310 Post-traumatic stress disorder, unspecified: Secondary | ICD-10-CM | POA: Diagnosis not present

## 2021-05-21 DIAGNOSIS — F411 Generalized anxiety disorder: Secondary | ICD-10-CM | POA: Diagnosis not present

## 2021-05-21 DIAGNOSIS — F909 Attention-deficit hyperactivity disorder, unspecified type: Secondary | ICD-10-CM | POA: Diagnosis not present

## 2021-05-28 DIAGNOSIS — F429 Obsessive-compulsive disorder, unspecified: Secondary | ICD-10-CM | POA: Diagnosis not present

## 2021-05-28 DIAGNOSIS — F909 Attention-deficit hyperactivity disorder, unspecified type: Secondary | ICD-10-CM | POA: Diagnosis not present

## 2021-05-28 DIAGNOSIS — F411 Generalized anxiety disorder: Secondary | ICD-10-CM | POA: Diagnosis not present

## 2021-05-28 DIAGNOSIS — F4310 Post-traumatic stress disorder, unspecified: Secondary | ICD-10-CM | POA: Diagnosis not present

## 2021-06-11 DIAGNOSIS — F429 Obsessive-compulsive disorder, unspecified: Secondary | ICD-10-CM | POA: Diagnosis not present

## 2021-06-11 DIAGNOSIS — F4310 Post-traumatic stress disorder, unspecified: Secondary | ICD-10-CM | POA: Diagnosis not present

## 2021-06-11 DIAGNOSIS — F909 Attention-deficit hyperactivity disorder, unspecified type: Secondary | ICD-10-CM | POA: Diagnosis not present

## 2021-06-11 DIAGNOSIS — F411 Generalized anxiety disorder: Secondary | ICD-10-CM | POA: Diagnosis not present

## 2021-06-24 DIAGNOSIS — F411 Generalized anxiety disorder: Secondary | ICD-10-CM | POA: Diagnosis not present

## 2021-06-24 DIAGNOSIS — F429 Obsessive-compulsive disorder, unspecified: Secondary | ICD-10-CM | POA: Diagnosis not present

## 2021-06-24 DIAGNOSIS — F4310 Post-traumatic stress disorder, unspecified: Secondary | ICD-10-CM | POA: Diagnosis not present

## 2021-06-24 DIAGNOSIS — F909 Attention-deficit hyperactivity disorder, unspecified type: Secondary | ICD-10-CM | POA: Diagnosis not present

## 2021-06-29 DIAGNOSIS — F411 Generalized anxiety disorder: Secondary | ICD-10-CM | POA: Diagnosis not present

## 2021-06-29 DIAGNOSIS — F410 Panic disorder [episodic paroxysmal anxiety] without agoraphobia: Secondary | ICD-10-CM | POA: Diagnosis not present

## 2021-06-29 DIAGNOSIS — F4312 Post-traumatic stress disorder, chronic: Secondary | ICD-10-CM | POA: Diagnosis not present

## 2021-06-29 DIAGNOSIS — F332 Major depressive disorder, recurrent severe without psychotic features: Secondary | ICD-10-CM | POA: Diagnosis not present

## 2021-06-30 DIAGNOSIS — F429 Obsessive-compulsive disorder, unspecified: Secondary | ICD-10-CM | POA: Diagnosis not present

## 2021-06-30 DIAGNOSIS — F4310 Post-traumatic stress disorder, unspecified: Secondary | ICD-10-CM | POA: Diagnosis not present

## 2021-06-30 DIAGNOSIS — F909 Attention-deficit hyperactivity disorder, unspecified type: Secondary | ICD-10-CM | POA: Diagnosis not present

## 2021-06-30 DIAGNOSIS — F411 Generalized anxiety disorder: Secondary | ICD-10-CM | POA: Diagnosis not present

## 2021-07-07 DIAGNOSIS — F4310 Post-traumatic stress disorder, unspecified: Secondary | ICD-10-CM | POA: Diagnosis not present

## 2021-07-07 DIAGNOSIS — F411 Generalized anxiety disorder: Secondary | ICD-10-CM | POA: Diagnosis not present

## 2021-07-07 DIAGNOSIS — F909 Attention-deficit hyperactivity disorder, unspecified type: Secondary | ICD-10-CM | POA: Diagnosis not present

## 2021-07-07 DIAGNOSIS — F429 Obsessive-compulsive disorder, unspecified: Secondary | ICD-10-CM | POA: Diagnosis not present

## 2021-07-14 DIAGNOSIS — F429 Obsessive-compulsive disorder, unspecified: Secondary | ICD-10-CM | POA: Diagnosis not present

## 2021-07-14 DIAGNOSIS — F411 Generalized anxiety disorder: Secondary | ICD-10-CM | POA: Diagnosis not present

## 2021-07-14 DIAGNOSIS — F909 Attention-deficit hyperactivity disorder, unspecified type: Secondary | ICD-10-CM | POA: Diagnosis not present

## 2021-07-14 DIAGNOSIS — F4310 Post-traumatic stress disorder, unspecified: Secondary | ICD-10-CM | POA: Diagnosis not present

## 2021-07-29 DIAGNOSIS — F909 Attention-deficit hyperactivity disorder, unspecified type: Secondary | ICD-10-CM | POA: Diagnosis not present

## 2021-07-29 DIAGNOSIS — F429 Obsessive-compulsive disorder, unspecified: Secondary | ICD-10-CM | POA: Diagnosis not present

## 2021-07-29 DIAGNOSIS — F411 Generalized anxiety disorder: Secondary | ICD-10-CM | POA: Diagnosis not present

## 2021-07-29 DIAGNOSIS — F4310 Post-traumatic stress disorder, unspecified: Secondary | ICD-10-CM | POA: Diagnosis not present

## 2021-08-11 DIAGNOSIS — F411 Generalized anxiety disorder: Secondary | ICD-10-CM | POA: Diagnosis not present

## 2021-08-11 DIAGNOSIS — F909 Attention-deficit hyperactivity disorder, unspecified type: Secondary | ICD-10-CM | POA: Diagnosis not present

## 2021-08-11 DIAGNOSIS — F4310 Post-traumatic stress disorder, unspecified: Secondary | ICD-10-CM | POA: Diagnosis not present

## 2021-08-11 DIAGNOSIS — F429 Obsessive-compulsive disorder, unspecified: Secondary | ICD-10-CM | POA: Diagnosis not present

## 2021-08-18 DIAGNOSIS — F4310 Post-traumatic stress disorder, unspecified: Secondary | ICD-10-CM | POA: Diagnosis not present

## 2021-08-18 DIAGNOSIS — F411 Generalized anxiety disorder: Secondary | ICD-10-CM | POA: Diagnosis not present

## 2021-08-18 DIAGNOSIS — F429 Obsessive-compulsive disorder, unspecified: Secondary | ICD-10-CM | POA: Diagnosis not present

## 2021-08-18 DIAGNOSIS — F909 Attention-deficit hyperactivity disorder, unspecified type: Secondary | ICD-10-CM | POA: Diagnosis not present

## 2021-08-25 DIAGNOSIS — F411 Generalized anxiety disorder: Secondary | ICD-10-CM | POA: Diagnosis not present

## 2021-08-25 DIAGNOSIS — F909 Attention-deficit hyperactivity disorder, unspecified type: Secondary | ICD-10-CM | POA: Diagnosis not present

## 2021-08-25 DIAGNOSIS — F429 Obsessive-compulsive disorder, unspecified: Secondary | ICD-10-CM | POA: Diagnosis not present

## 2021-08-25 DIAGNOSIS — F4310 Post-traumatic stress disorder, unspecified: Secondary | ICD-10-CM | POA: Diagnosis not present

## 2021-09-09 DIAGNOSIS — F909 Attention-deficit hyperactivity disorder, unspecified type: Secondary | ICD-10-CM | POA: Diagnosis not present

## 2021-09-09 DIAGNOSIS — F429 Obsessive-compulsive disorder, unspecified: Secondary | ICD-10-CM | POA: Diagnosis not present

## 2021-09-09 DIAGNOSIS — F4310 Post-traumatic stress disorder, unspecified: Secondary | ICD-10-CM | POA: Diagnosis not present

## 2021-09-09 DIAGNOSIS — F411 Generalized anxiety disorder: Secondary | ICD-10-CM | POA: Diagnosis not present

## 2021-09-21 DIAGNOSIS — F4310 Post-traumatic stress disorder, unspecified: Secondary | ICD-10-CM | POA: Diagnosis not present

## 2021-09-21 DIAGNOSIS — F909 Attention-deficit hyperactivity disorder, unspecified type: Secondary | ICD-10-CM | POA: Diagnosis not present

## 2021-09-21 DIAGNOSIS — F411 Generalized anxiety disorder: Secondary | ICD-10-CM | POA: Diagnosis not present

## 2021-09-21 DIAGNOSIS — F429 Obsessive-compulsive disorder, unspecified: Secondary | ICD-10-CM | POA: Diagnosis not present

## 2021-09-29 DIAGNOSIS — F4310 Post-traumatic stress disorder, unspecified: Secondary | ICD-10-CM | POA: Diagnosis not present

## 2021-09-29 DIAGNOSIS — F429 Obsessive-compulsive disorder, unspecified: Secondary | ICD-10-CM | POA: Diagnosis not present

## 2021-09-29 DIAGNOSIS — F909 Attention-deficit hyperactivity disorder, unspecified type: Secondary | ICD-10-CM | POA: Diagnosis not present

## 2021-09-29 DIAGNOSIS — F411 Generalized anxiety disorder: Secondary | ICD-10-CM | POA: Diagnosis not present

## 2021-10-13 DIAGNOSIS — F429 Obsessive-compulsive disorder, unspecified: Secondary | ICD-10-CM | POA: Diagnosis not present

## 2021-10-13 DIAGNOSIS — F909 Attention-deficit hyperactivity disorder, unspecified type: Secondary | ICD-10-CM | POA: Diagnosis not present

## 2021-10-13 DIAGNOSIS — F4310 Post-traumatic stress disorder, unspecified: Secondary | ICD-10-CM | POA: Diagnosis not present

## 2021-10-13 DIAGNOSIS — F411 Generalized anxiety disorder: Secondary | ICD-10-CM | POA: Diagnosis not present

## 2021-10-21 DIAGNOSIS — F332 Major depressive disorder, recurrent severe without psychotic features: Secondary | ICD-10-CM | POA: Diagnosis not present

## 2021-10-21 DIAGNOSIS — F429 Obsessive-compulsive disorder, unspecified: Secondary | ICD-10-CM | POA: Diagnosis not present

## 2021-10-21 DIAGNOSIS — F909 Attention-deficit hyperactivity disorder, unspecified type: Secondary | ICD-10-CM | POA: Diagnosis not present

## 2021-10-21 DIAGNOSIS — F410 Panic disorder [episodic paroxysmal anxiety] without agoraphobia: Secondary | ICD-10-CM | POA: Diagnosis not present

## 2021-10-21 DIAGNOSIS — F4310 Post-traumatic stress disorder, unspecified: Secondary | ICD-10-CM | POA: Diagnosis not present

## 2021-10-21 DIAGNOSIS — F900 Attention-deficit hyperactivity disorder, predominantly inattentive type: Secondary | ICD-10-CM | POA: Diagnosis not present

## 2021-10-21 DIAGNOSIS — F4312 Post-traumatic stress disorder, chronic: Secondary | ICD-10-CM | POA: Diagnosis not present

## 2021-10-21 DIAGNOSIS — F411 Generalized anxiety disorder: Secondary | ICD-10-CM | POA: Diagnosis not present

## 2021-12-30 DIAGNOSIS — E119 Type 2 diabetes mellitus without complications: Secondary | ICD-10-CM

## 2021-12-30 HISTORY — DX: Type 2 diabetes mellitus without complications: E11.9

## 2022-01-14 DIAGNOSIS — E559 Vitamin D deficiency, unspecified: Secondary | ICD-10-CM | POA: Diagnosis not present

## 2022-01-14 DIAGNOSIS — Z0000 Encounter for general adult medical examination without abnormal findings: Secondary | ICD-10-CM | POA: Diagnosis not present

## 2022-01-19 ENCOUNTER — Other Ambulatory Visit: Payer: Self-pay | Admitting: Nurse Practitioner

## 2022-01-19 DIAGNOSIS — R7401 Elevation of levels of liver transaminase levels: Secondary | ICD-10-CM

## 2022-01-19 DIAGNOSIS — R748 Abnormal levels of other serum enzymes: Secondary | ICD-10-CM

## 2022-01-23 IMAGING — CT CT ABD-PELV W/ CM
2 of 4 series · 16 of 46 positions shown, 18 images · IV contrast (APPLIED)
Comparison: None.

CLINICAL DATA: Nausea and vomiting. Intermittent upper and lower
abdominal pain for 3 days.

EXAM:
CT ABDOMEN AND PELVIS WITH CONTRAST
TECHNIQUE: Multidetector CT imaging of the abdomen and pelvis was performed
using the standard protocol following bolus administration of
intravenous contrast.
CONTRAST:  80mL OMNIPAQUE IOHEXOL 300 MG/ML  SOLN

[Series 3: abdomen 5.0 · axial · 0.72mm/px · z∈[+662,+1062]mm · 13 of 92 slices shown, 15 images]
[im 6/92  soft-tissue]
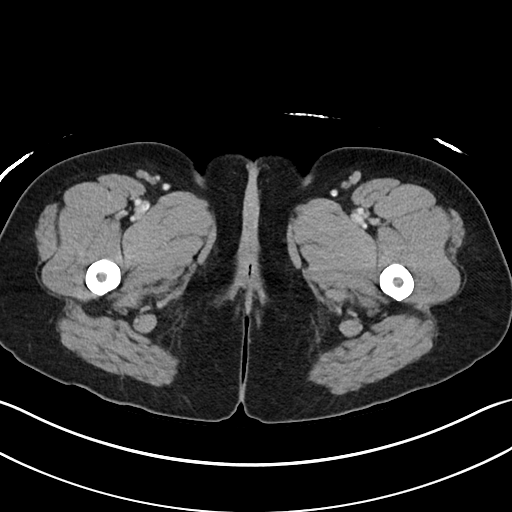
[im 6/92  bone]
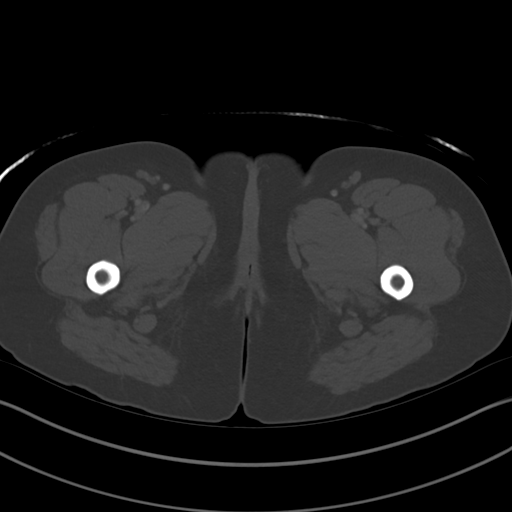
[im 12/92  soft-tissue]
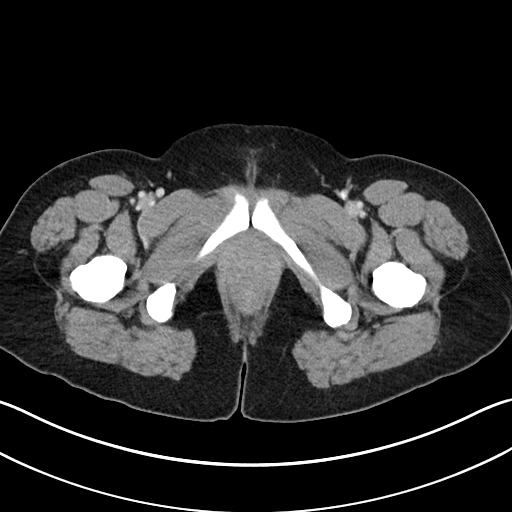
[im 18/92  soft-tissue]
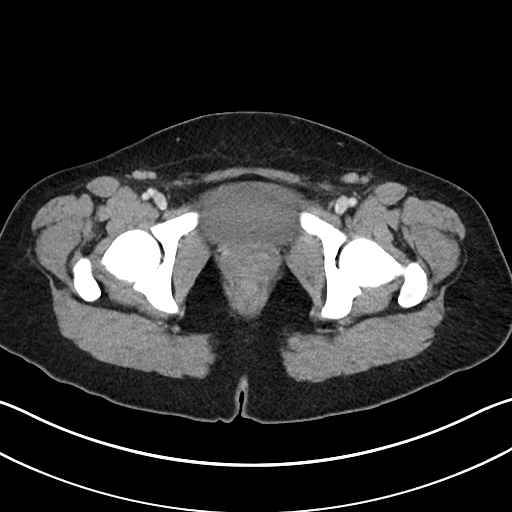
[im 29/92  soft-tissue]
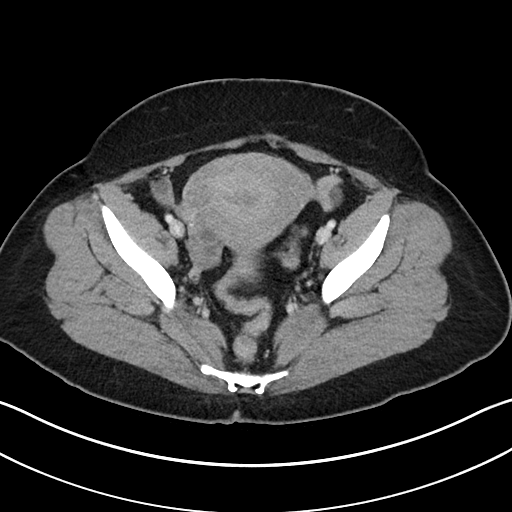
[im 35/92  soft-tissue]
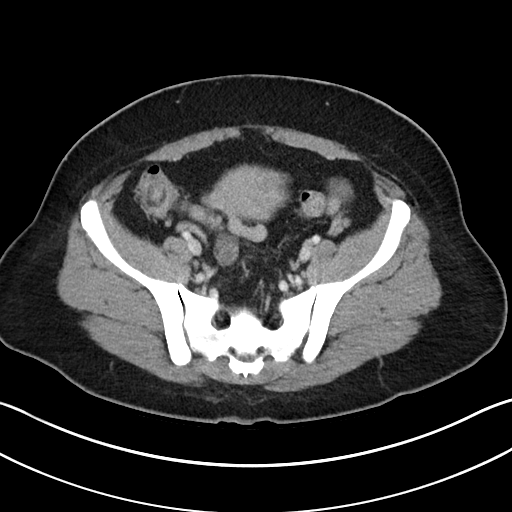
[im 40/92  soft-tissue]
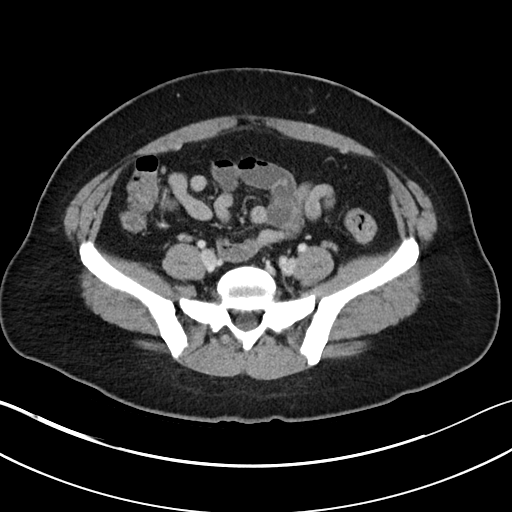
[im 46/92  soft-tissue]
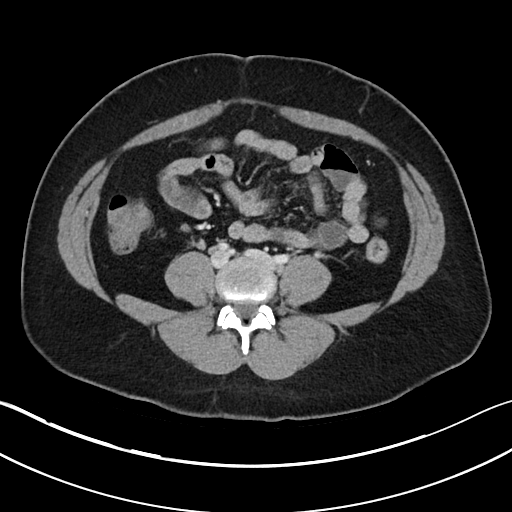
[im 52/92  soft-tissue]
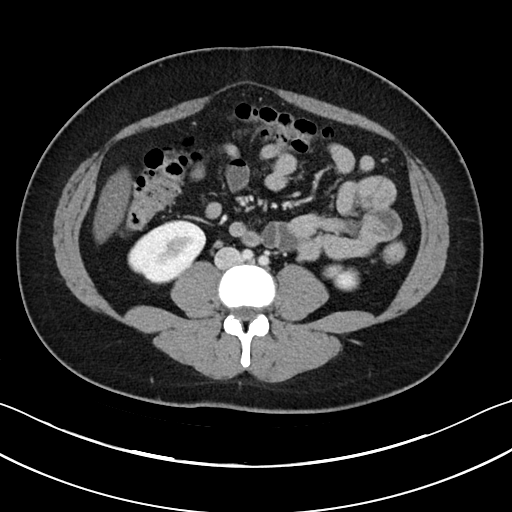
[im 57/92  soft-tissue]
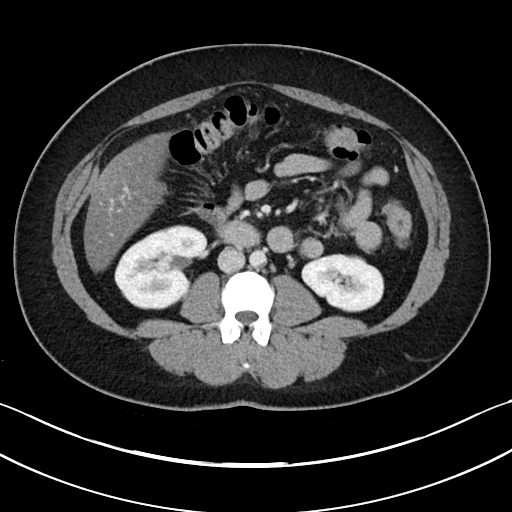
[im 57/92  bone]
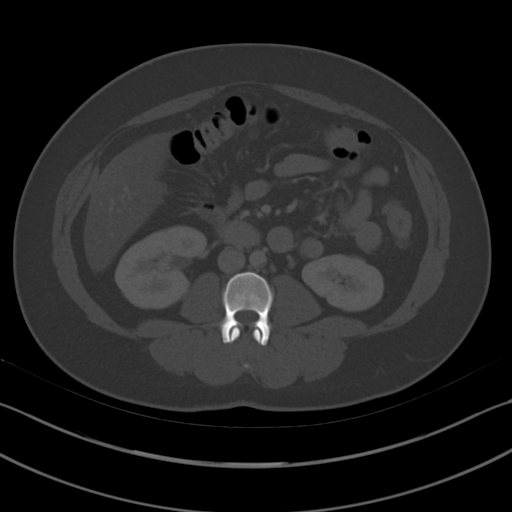
[im 63/92  soft-tissue]
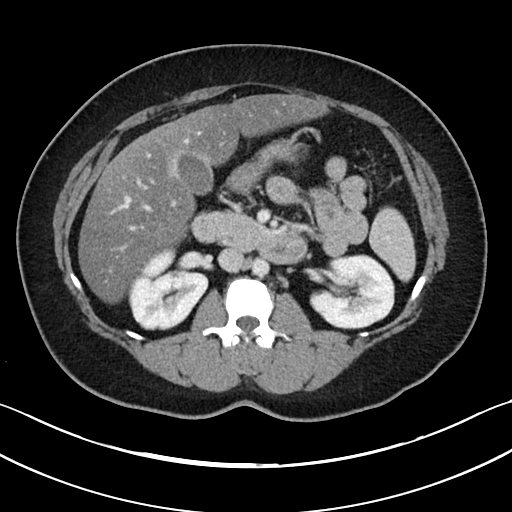
[im 74/92  soft-tissue]
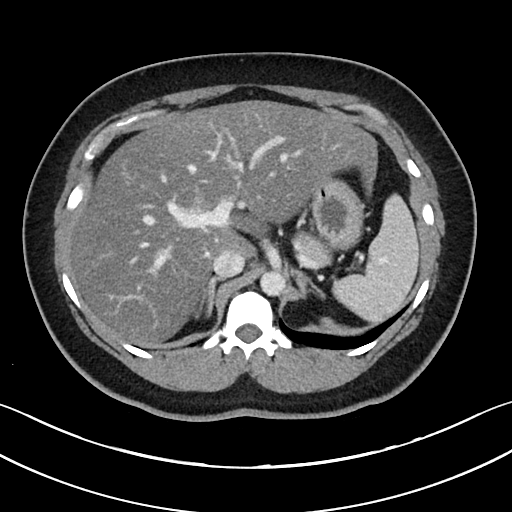
[im 80/92  soft-tissue]
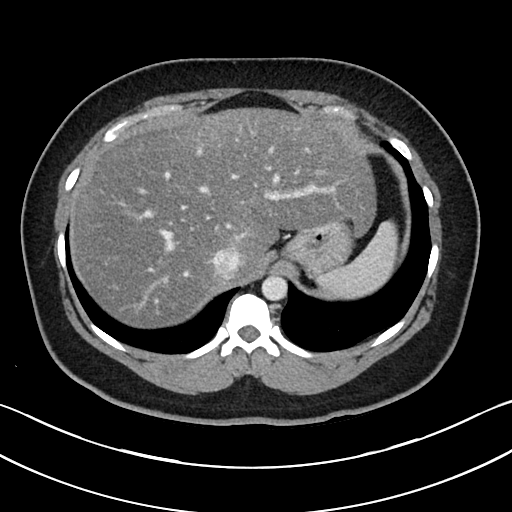
[im 86/92  soft-tissue]
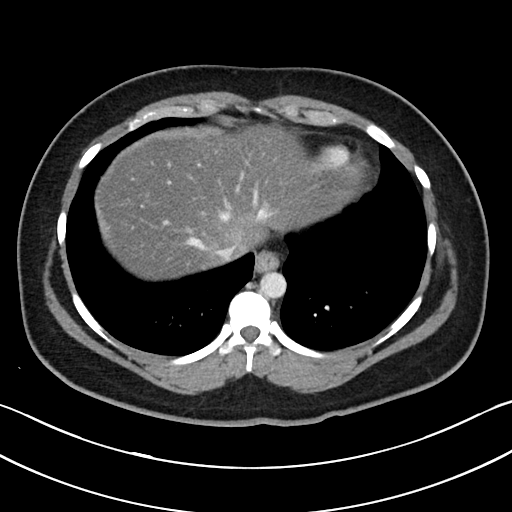

[Series 6: abdomen 3.0 mpr cor · coronal · 0.72mm/px · 3 of 93 slices shown]
[im 31/93  soft-tissue]
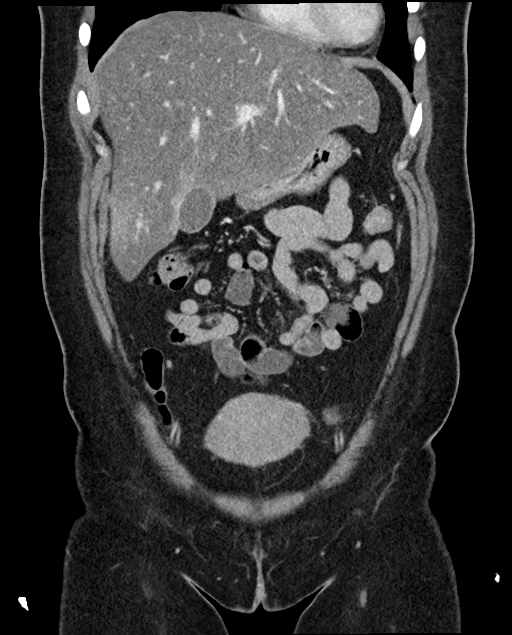
[im 41/93  soft-tissue]
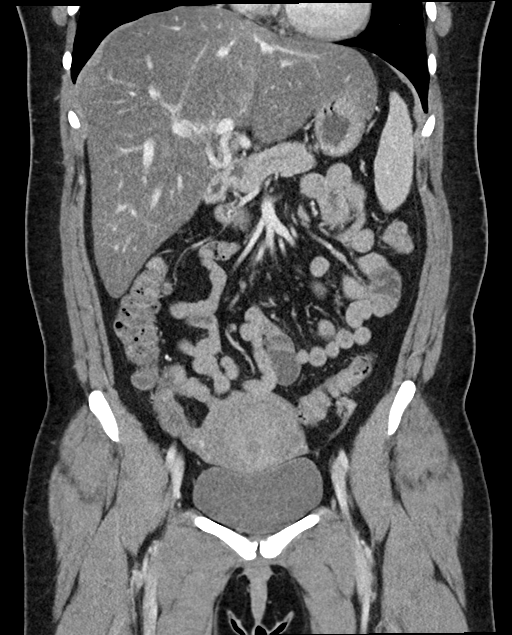
[im 52/93  soft-tissue]
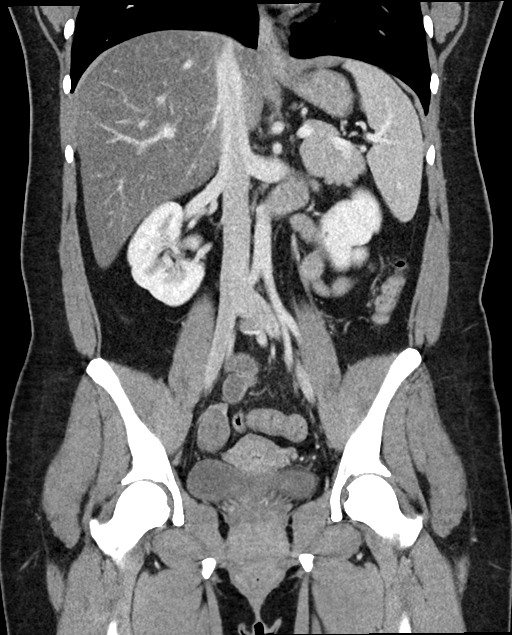

[16 of 46 positions shown; findings below may reference images not displayed]

FINDINGS: Lower chest:  No contributory findings.

Hepatobiliary: Marked hepatic steatosis. There is a rounded
hypervascular area in the upper left liver measuring 15 mm. No noted
cirrhotic changes. No evidence of biliary obstruction or stone.

Pancreas: Unremarkable.

Spleen: Unremarkable.

Adrenals/Urinary Tract: Negative adrenals. No hydronephrosis or
stone. Unremarkable bladder.

Stomach/Bowel:  No obstruction. No appendicitis.

Vascular/Lymphatic: No acute vascular abnormality. No mass or
adenopathy.

Reproductive:Somewhat globular appearance of the uterus which may be
from adenomyosis or recent delivery. There may have likely been a
prior Caesarean section.

Other: No ascites or pneumoperitoneum.

Musculoskeletal: No acute abnormalities.
IMPRESSION: No acute finding.

Hypervascular nodule in the upper left liver measuring 15 mm. Based
on demographics this usually reflects hemangioma or adenoma. There
is marked hepatic steatosis and follow-up is recommended.

## 2022-01-25 DIAGNOSIS — F322 Major depressive disorder, single episode, severe without psychotic features: Secondary | ICD-10-CM | POA: Diagnosis not present

## 2022-01-25 DIAGNOSIS — F4310 Post-traumatic stress disorder, unspecified: Secondary | ICD-10-CM | POA: Diagnosis not present

## 2022-01-25 DIAGNOSIS — F419 Anxiety disorder, unspecified: Secondary | ICD-10-CM | POA: Diagnosis not present

## 2022-01-25 DIAGNOSIS — H4710 Unspecified papilledema: Secondary | ICD-10-CM | POA: Diagnosis not present

## 2022-01-25 DIAGNOSIS — R519 Headache, unspecified: Secondary | ICD-10-CM | POA: Diagnosis not present

## 2022-01-25 DIAGNOSIS — E559 Vitamin D deficiency, unspecified: Secondary | ICD-10-CM | POA: Diagnosis not present

## 2022-01-25 DIAGNOSIS — E1165 Type 2 diabetes mellitus with hyperglycemia: Secondary | ICD-10-CM | POA: Diagnosis not present

## 2022-01-27 ENCOUNTER — Other Ambulatory Visit: Payer: Self-pay | Admitting: Neurology

## 2022-01-27 DIAGNOSIS — H4710 Unspecified papilledema: Secondary | ICD-10-CM

## 2022-01-27 DIAGNOSIS — R519 Headache, unspecified: Secondary | ICD-10-CM

## 2022-02-02 ENCOUNTER — Other Ambulatory Visit: Payer: Self-pay | Admitting: Neurology

## 2022-02-02 ENCOUNTER — Ambulatory Visit
Admission: RE | Admit: 2022-02-02 | Discharge: 2022-02-02 | Disposition: A | Discharge status: Home / Self Care | Payer: Medicaid Other | Source: Ambulatory Visit | Attending: Nurse Practitioner | Admitting: Nurse Practitioner

## 2022-02-02 DIAGNOSIS — R7401 Elevation of levels of liver transaminase levels: Secondary | ICD-10-CM

## 2022-02-02 DIAGNOSIS — R7989 Other specified abnormal findings of blood chemistry: Secondary | ICD-10-CM | POA: Diagnosis not present

## 2022-02-02 DIAGNOSIS — R748 Abnormal levels of other serum enzymes: Secondary | ICD-10-CM

## 2022-02-02 DIAGNOSIS — K760 Fatty (change of) liver, not elsewhere classified: Secondary | ICD-10-CM | POA: Diagnosis not present

## 2022-02-02 DIAGNOSIS — G8929 Other chronic pain: Secondary | ICD-10-CM

## 2022-02-08 ENCOUNTER — Ambulatory Visit: Payer: Medicaid Other | Admitting: Internal Medicine

## 2022-02-08 NOTE — Progress Notes (Signed)
Pt did not show for scheduled appointment.  

## 2022-03-03 ENCOUNTER — Other Ambulatory Visit: Payer: Self-pay | Admitting: Neurology

## 2022-03-03 DIAGNOSIS — H4710 Unspecified papilledema: Secondary | ICD-10-CM

## 2022-03-03 DIAGNOSIS — G8929 Other chronic pain: Secondary | ICD-10-CM

## 2022-03-04 ENCOUNTER — Ambulatory Visit
Admission: RE | Admit: 2022-03-04 | Discharge: 2022-03-04 | Disposition: A | Discharge status: Home / Self Care | Payer: Medicaid Other | Source: Ambulatory Visit | Attending: Neurology | Admitting: Neurology

## 2022-03-04 DIAGNOSIS — R519 Headache, unspecified: Secondary | ICD-10-CM

## 2022-03-04 DIAGNOSIS — R42 Dizziness and giddiness: Secondary | ICD-10-CM | POA: Diagnosis not present

## 2022-03-04 DIAGNOSIS — H4710 Unspecified papilledema: Secondary | ICD-10-CM | POA: Diagnosis not present

## 2022-03-04 DIAGNOSIS — G43909 Migraine, unspecified, not intractable, without status migrainosus: Secondary | ICD-10-CM | POA: Diagnosis not present

## 2022-03-04 DIAGNOSIS — J341 Cyst and mucocele of nose and nasal sinus: Secondary | ICD-10-CM | POA: Diagnosis not present

## 2022-03-04 MED ORDER — GADOBENATE DIMEGLUMINE 529 MG/ML IV SOLN
15.0000 mL | Freq: Once | INTRAVENOUS | Status: AC | PRN
  Administered 2022-03-04: 15 mL via INTRAVENOUS

## 2022-03-04 NOTE — Progress Notes (Signed)
Patient for DG Lumbar Puncture on Friday 03/05/2022, I called and spoke with the patient's POA Heather on the phone and gave pre-procedure instructions. Pt's POA was made aware to have the patient here at 8:30a at the new entrance. Pt's POA stated understanding. Called 03/04/2022

## 2022-03-05 ENCOUNTER — Ambulatory Visit
Admission: RE | Admit: 2022-03-05 | Discharge: 2022-03-05 | Disposition: A | Discharge status: Home / Self Care | Payer: Medicaid Other | Source: Ambulatory Visit | Attending: Neurology | Admitting: Neurology

## 2022-03-05 DIAGNOSIS — R519 Headache, unspecified: Secondary | ICD-10-CM | POA: Insufficient documentation

## 2022-03-05 DIAGNOSIS — H4710 Unspecified papilledema: Secondary | ICD-10-CM | POA: Insufficient documentation

## 2022-03-05 LAB — PROTEIN AND GLUCOSE, CSF
Glucose, CSF: 82 mg/dL — ABNORMAL HIGH (ref 40–70)
Total  Protein, CSF: 18 mg/dL (ref 15–45)

## 2022-03-05 LAB — CSF CELL COUNT WITH DIFFERENTIAL
Eosinophils, CSF: 0 %
Lymphs, CSF: 73 %
Monocyte-Macrophage-Spinal Fluid: 25 %
RBC Count, CSF: 12 /mm3 — ABNORMAL HIGH (ref 0–3)
Segmented Neutrophils-CSF: 2 %
Tube #: 3
WBC, CSF: 7 /mm3 — ABNORMAL HIGH (ref 0–5)

## 2022-03-05 MED ORDER — LIDOCAINE HCL (PF) 1 % IJ SOLN
10.0000 mL | Freq: Once | INTRAMUSCULAR | Status: AC
  Administered 2022-03-05: 5 mL

## 2022-03-05 NOTE — Procedures (Signed)
L3/L4 lumbar puncture yielding 10 ml clear CSF for lab studies. Please see full dictation under the imaging tab in Epic.  Soyla Dryer, [place] [phone] 03/05/2022, 10:41 AM

## 2022-03-08 LAB — CSF IGG: IgG, CSF: 1.4 mg/dL (ref 0.0–6.7)

## 2022-03-08 LAB — GLUCOSE, CAPILLARY: Glucose-Capillary: 151 mg/dL — ABNORMAL HIGH (ref 70–99)

## 2022-03-10 LAB — OLIGOCLONAL BANDS, CSF + SERM

## 2022-03-11 DIAGNOSIS — F4312 Post-traumatic stress disorder, chronic: Secondary | ICD-10-CM | POA: Diagnosis not present

## 2022-03-17 DIAGNOSIS — Z720 Tobacco use: Secondary | ICD-10-CM | POA: Diagnosis not present

## 2022-03-17 DIAGNOSIS — E1165 Type 2 diabetes mellitus with hyperglycemia: Secondary | ICD-10-CM | POA: Diagnosis not present

## 2022-03-17 DIAGNOSIS — F419 Anxiety disorder, unspecified: Secondary | ICD-10-CM | POA: Diagnosis not present

## 2022-03-17 DIAGNOSIS — H4710 Unspecified papilledema: Secondary | ICD-10-CM | POA: Diagnosis not present

## 2022-03-17 DIAGNOSIS — F4310 Post-traumatic stress disorder, unspecified: Secondary | ICD-10-CM | POA: Diagnosis not present

## 2022-03-17 DIAGNOSIS — F4312 Post-traumatic stress disorder, chronic: Secondary | ICD-10-CM | POA: Diagnosis not present

## 2022-03-17 DIAGNOSIS — G932 Benign intracranial hypertension: Secondary | ICD-10-CM | POA: Diagnosis not present

## 2022-03-17 DIAGNOSIS — R519 Headache, unspecified: Secondary | ICD-10-CM | POA: Diagnosis not present

## 2022-03-17 DIAGNOSIS — F4000 Agoraphobia, unspecified: Secondary | ICD-10-CM | POA: Diagnosis not present

## 2022-03-18 DIAGNOSIS — F419 Anxiety disorder, unspecified: Secondary | ICD-10-CM | POA: Diagnosis not present

## 2022-03-18 DIAGNOSIS — G43809 Other migraine, not intractable, without status migrainosus: Secondary | ICD-10-CM | POA: Diagnosis not present

## 2022-03-18 DIAGNOSIS — H4710 Unspecified papilledema: Secondary | ICD-10-CM | POA: Diagnosis not present

## 2022-03-18 DIAGNOSIS — G932 Benign intracranial hypertension: Secondary | ICD-10-CM | POA: Diagnosis not present

## 2022-03-18 DIAGNOSIS — R42 Dizziness and giddiness: Secondary | ICD-10-CM | POA: Diagnosis not present

## 2022-03-18 DIAGNOSIS — E1165 Type 2 diabetes mellitus with hyperglycemia: Secondary | ICD-10-CM | POA: Diagnosis not present

## 2022-03-24 DIAGNOSIS — R55 Syncope and collapse: Secondary | ICD-10-CM | POA: Diagnosis not present

## 2022-03-24 DIAGNOSIS — F4312 Post-traumatic stress disorder, chronic: Secondary | ICD-10-CM | POA: Diagnosis not present

## 2022-03-26 ENCOUNTER — Emergency Department
Admission: EM | Admit: 2022-03-26 | Discharge: 2022-03-26 | Disposition: A | Discharge status: Home / Self Care | Payer: Medicaid Other | Attending: Student in an Organized Health Care Education/Training Program | Admitting: Student in an Organized Health Care Education/Training Program

## 2022-03-26 ENCOUNTER — Other Ambulatory Visit: Payer: Self-pay

## 2022-03-26 ENCOUNTER — Emergency Department: Payer: Medicaid Other

## 2022-03-26 DIAGNOSIS — H05011 Cellulitis of right orbit: Secondary | ICD-10-CM | POA: Diagnosis not present

## 2022-03-26 DIAGNOSIS — R519 Headache, unspecified: Secondary | ICD-10-CM | POA: Insufficient documentation

## 2022-03-26 DIAGNOSIS — R000 Tachycardia, unspecified: Secondary | ICD-10-CM | POA: Diagnosis not present

## 2022-03-26 DIAGNOSIS — L03213 Periorbital cellulitis: Secondary | ICD-10-CM | POA: Diagnosis not present

## 2022-03-26 DIAGNOSIS — F17210 Nicotine dependence, cigarettes, uncomplicated: Secondary | ICD-10-CM | POA: Diagnosis not present

## 2022-03-26 LAB — BASIC METABOLIC PANEL
Anion gap: 11 (ref 5–15)
BUN: 10 mg/dL (ref 6–20)
CO2: 24 mmol/L (ref 22–32)
Calcium: 9.5 mg/dL (ref 8.9–10.3)
Chloride: 103 mmol/L (ref 98–111)
Creatinine, Ser: 0.87 mg/dL (ref 0.44–1.00)
GFR, Estimated: >60 mL/min (ref >60)
Glucose, Bld: 237 mg/dL — ABNORMAL HIGH (ref 70–99)
Potassium: 4.1 mmol/L (ref 3.5–5.1)
Sodium: 138 mmol/L (ref 135–145)

## 2022-03-26 LAB — CBC
HCT: 42.4 % (ref 36.0–46.0)
Hemoglobin: 14.3 g/dL (ref 12.0–15.0)
MCH: 30.4 pg (ref 26.0–34.0)
MCHC: 33.7 g/dL (ref 30.0–36.0)
MCV: 90 fL (ref 80.0–100.0)
Platelets: 316 10*3/uL (ref 150–400)
RBC: 4.71 MIL/uL (ref 3.87–5.11)
RDW: 14.3 % (ref 11.5–15.5)
WBC: 9.2 10*3/uL (ref 4.0–10.5)
nRBC: 0 % (ref 0.0–0.2)

## 2022-03-26 LAB — TROPONIN I (HIGH SENSITIVITY)
Troponin I (High Sensitivity): 3 ng/L (ref <18)
Troponin I (High Sensitivity): 4 ng/L (ref <18)

## 2022-03-26 NOTE — ED Triage Notes (Signed)
Pt to ED from home for right eye pain and HTN. Pt was diagnosed with the intracranial HTN about 3-4 weeks ago by MD Manuella Ghazi. Pt was placed on a BP med and it wasn't working so they told her to stop for a two week period. Today she is hypertensive. Pt denies any other symptoms. Pt right eye is obviously bigger than normal.

## 2022-03-26 NOTE — ED Notes (Signed)
Pt c/o headache and facial pain- states she wants to smoke a cigarette. Pt is AOX4, NAD noted. Warm blanket offered.

## 2022-03-26 NOTE — Discharge Instructions (Signed)

## 2022-03-26 NOTE — ED Provider Notes (Signed)
[facility] Provider Note    Event Date/Time   First MD Initiated Contact with Patient 03/26/22 1858     (approximate)   History   Hypertension and Eye Pain (RIGHT)   HPI  Joyce Swanson is a 32 y.o. female recent diagnosis of [place] presents to the ER for evaluation of right eye pressure headache.  Is atypical for her migraines.  States her blood pressure is also been a bit elevated.  Recently taken off of acetazolamide as she was not tolerating this.  States she called her neurologist who sent her to the ER due to concern for stroke.  Denies any numbness or tingling.  No visual disturbances.  No trouble swallowing.  No speech difficulties.  No chest pain or shortness of breath.     Physical Exam   Triage Vital Signs: ED Triage Vitals  Enc Vitals Group     BP 03/26/22 1556 (!) 148/111     Pulse Rate 03/26/22 1556 (!) 120     Resp 03/26/22 1556 18     Temp 03/26/22 1556 99.1 F (37.3 C)     Temp Source 03/26/22 1556 Oral     SpO2 03/26/22 1556 96 %     Weight 03/26/22 1602 169 lb 12.1 oz (77 kg)     Height 03/26/22 1602 '5\' 1"'$  (1.549 m)     Head Circumference --      Peak Flow --      Pain Score 03/26/22 1602 4     Pain Loc --      Pain Edu? --      Excl. in [place]? --     Most recent vital signs: Vitals:   03/26/22 1930 03/26/22 2013  BP: (!) 136/95   Pulse: (!) 102   Resp: 19   Temp:  99 F (37.2 C)  SpO2: 96%      Constitutional: Alert  Eyes: Conjunctivae are normal. EOMI, no proptosis, no erythema  Head: Atraumatic. Nose: No congestion/rhinnorhea. Mouth/Throat: Mucous membranes are moist.   Neck: Painless ROM.  Cardiovascular:   Good peripheral circulation. Respiratory: Normal respiratory effort.  No retractions.  Gastrointestinal: Soft and nontender.  Musculoskeletal:  no deformity Neurologic: CN- intact.  No facial droop, Normal FNF.  Normal heel to shin.  Sensation intact bilaterally. Normal speech and language. No gross focal  neurologic deficits are appreciated. No gait instability. Skin:  Skin is warm, dry and intact. No rash noted. Psychiatric: Mood and affect are normal. Speech and behavior are normal.    ED Results / Procedures / Treatments   Labs (all labs ordered are listed, but only abnormal results are displayed) Labs Reviewed  BASIC METABOLIC PANEL - Abnormal; Notable for the following components:      Result Value   Glucose, Bld 237 (*)    All other components within normal limits  CBC  POC URINE PREG, ED  TROPONIN I (HIGH SENSITIVITY)  TROPONIN I (HIGH SENSITIVITY)     EKG  ED ECG REPORT I, Merlyn Lot, the attending physician, personally viewed and interpreted this ECG.   Date: 03/26/2022  EKG Time: 16:00  Rate: 130  Rhythm: sinus  Axis: normal  Intervals:normal  ST&T Change: no stemi, no depressions    RADIOLOGY Please see ED Course for my review and interpretation.  I personally reviewed all radiographic images ordered to evaluate for the above acute complaints and reviewed radiology reports and findings.  These findings were personally discussed with the patient.  Please  see medical record for radiology report.    PROCEDURES:  Critical Care performed:   Procedures   MEDICATIONS ORDERED IN ED: Medications - No data to display   IMPRESSION / MDM / [place] / ED COURSE  I reviewed the triage vital signs and the nursing notes.                              Differential diagnosis includes, but is not limited to, atypical migraine, ICH, IPH, CVA,  mass, oral cellulitis, glaucoma, tension  Patient presenting to the ER for evaluation of symptoms as described above.  Based on symptoms, risk factors and considered above differential, this presenting complaint could reflect a potentially life-threatening illness therefore the patient will be placed on continuous pulse oximetry and telemetry for monitoring.  Laboratory evaluation will be sent to evaluate for  the above complaints.      Clinical Course as of 03/26/22 2029  Fri Mar 26, 2022  2026 IOP 15.  Not consistent with glaucoma.  ET imaging on my review and interpretation without evidence of acute intracranial abnormality.  CT head with formal read with no acute findings.  No fever.  Her neuroexam is nonfocal.  Not consistent with CVA.  Do not feel that MRI or further diagnostic testing clinically indicated.  Possible atypical migraine.  Patient declining any migraine cocktail or medication at this point stating that she just wants to go home and follow-up with PCP which given her otherwise well appearance does appear reasonable. [PR]    Clinical Course User Index [PR] Merlyn Lot, MD     FINAL CLINICAL IMPRESSION(S) / ED DIAGNOSES   Final diagnoses:  Acute nonintractable headache, unspecified headache type     Rx / DC Orders   ED Discharge Orders     None        Note:  This document was prepared using Dragon voice recognition software and may include unintentional dictation errors.    Merlyn Lot, MD 03/26/22 2029

## 2022-03-30 DIAGNOSIS — F4312 Post-traumatic stress disorder, chronic: Secondary | ICD-10-CM | POA: Diagnosis not present

## 2022-04-01 DIAGNOSIS — D170 Benign lipomatous neoplasm of skin and subcutaneous tissue of head, face and neck: Secondary | ICD-10-CM | POA: Diagnosis not present

## 2022-04-01 DIAGNOSIS — H4710 Unspecified papilledema: Secondary | ICD-10-CM | POA: Diagnosis not present

## 2022-04-01 DIAGNOSIS — F4312 Post-traumatic stress disorder, chronic: Secondary | ICD-10-CM | POA: Diagnosis not present

## 2022-04-01 DIAGNOSIS — L603 Nail dystrophy: Secondary | ICD-10-CM | POA: Diagnosis not present

## 2022-04-01 DIAGNOSIS — G43809 Other migraine, not intractable, without status migrainosus: Secondary | ICD-10-CM | POA: Diagnosis not present

## 2022-04-01 DIAGNOSIS — G932 Benign intracranial hypertension: Secondary | ICD-10-CM | POA: Diagnosis not present

## 2022-04-02 DIAGNOSIS — M542 Cervicalgia: Secondary | ICD-10-CM | POA: Diagnosis not present

## 2022-04-02 DIAGNOSIS — R519 Headache, unspecified: Secondary | ICD-10-CM | POA: Diagnosis not present

## 2022-04-02 DIAGNOSIS — M545 Low back pain, unspecified: Secondary | ICD-10-CM | POA: Diagnosis not present

## 2022-04-02 DIAGNOSIS — M546 Pain in thoracic spine: Secondary | ICD-10-CM | POA: Diagnosis not present

## 2022-04-06 DIAGNOSIS — F4312 Post-traumatic stress disorder, chronic: Secondary | ICD-10-CM | POA: Diagnosis not present

## 2022-04-07 DIAGNOSIS — M542 Cervicalgia: Secondary | ICD-10-CM | POA: Diagnosis not present

## 2022-04-07 DIAGNOSIS — M545 Low back pain, unspecified: Secondary | ICD-10-CM | POA: Diagnosis not present

## 2022-04-07 DIAGNOSIS — M546 Pain in thoracic spine: Secondary | ICD-10-CM | POA: Diagnosis not present

## 2022-04-07 DIAGNOSIS — R519 Headache, unspecified: Secondary | ICD-10-CM | POA: Diagnosis not present

## 2022-04-08 ENCOUNTER — Other Ambulatory Visit: Payer: Self-pay

## 2022-04-08 ENCOUNTER — Emergency Department
Admission: EM | Admit: 2022-04-08 | Discharge: 2022-04-08 | Disposition: A | Discharge status: Home / Self Care | Payer: Medicaid Other | Attending: Emergency Medicine | Admitting: Emergency Medicine

## 2022-04-08 DIAGNOSIS — N3090 Cystitis, unspecified without hematuria: Secondary | ICD-10-CM | POA: Diagnosis not present

## 2022-04-08 DIAGNOSIS — N390 Urinary tract infection, site not specified: Secondary | ICD-10-CM

## 2022-04-08 DIAGNOSIS — F419 Anxiety disorder, unspecified: Secondary | ICD-10-CM | POA: Diagnosis not present

## 2022-04-08 DIAGNOSIS — J45909 Unspecified asthma, uncomplicated: Secondary | ICD-10-CM | POA: Insufficient documentation

## 2022-04-08 DIAGNOSIS — M62830 Muscle spasm of back: Secondary | ICD-10-CM

## 2022-04-08 DIAGNOSIS — I10 Essential (primary) hypertension: Secondary | ICD-10-CM | POA: Insufficient documentation

## 2022-04-08 LAB — URINALYSIS, COMPLETE (UACMP) WITH MICROSCOPIC
Bilirubin Urine: NEGATIVE
Glucose, UA: NEGATIVE mg/dL
Hgb urine dipstick: NEGATIVE
Ketones, ur: NEGATIVE mg/dL
Nitrite: NEGATIVE
Protein, ur: NEGATIVE mg/dL
Specific Gravity, Urine: 1.023 (ref 1.005–1.030)
pH: 6 (ref 5.0–8.0)

## 2022-04-08 LAB — COMPREHENSIVE METABOLIC PANEL
ALT: 91 U/L — ABNORMAL HIGH (ref 0–44)
AST: 68 U/L — ABNORMAL HIGH (ref 15–41)
Albumin: 4.4 g/dL (ref 3.5–5.0)
Alkaline Phosphatase: 122 U/L (ref 38–126)
Anion gap: 10 (ref 5–15)
BUN: 15 mg/dL (ref 6–20)
CO2: 22 mmol/L (ref 22–32)
Calcium: 9.3 mg/dL (ref 8.9–10.3)
Chloride: 101 mmol/L (ref 98–111)
Creatinine, Ser: 0.65 mg/dL (ref 0.44–1.00)
GFR, Estimated: >60 mL/min (ref >60)
Glucose, Bld: 168 mg/dL — ABNORMAL HIGH (ref 70–99)
Potassium: 3.9 mmol/L (ref 3.5–5.1)
Sodium: 133 mmol/L — ABNORMAL LOW (ref 135–145)
Total Bilirubin: 0.6 mg/dL (ref 0.3–1.2)
Total Protein: 8.3 g/dL — ABNORMAL HIGH (ref 6.5–8.1)

## 2022-04-08 LAB — CBC WITH DIFFERENTIAL/PLATELET
Abs Immature Granulocytes: 0.06 10*3/uL (ref 0.00–0.07)
Basophils Absolute: 0 10*3/uL (ref 0.0–0.1)
Basophils Relative: 0 %
Eosinophils Absolute: 0.5 10*3/uL (ref 0.0–0.5)
Eosinophils Relative: 5 %
HCT: 43.9 % (ref 36.0–46.0)
Hemoglobin: 14.5 g/dL (ref 12.0–15.0)
Immature Granulocytes: 1 %
Lymphocytes Relative: 37 %
Lymphs Abs: 3.9 10*3/uL (ref 0.7–4.0)
MCH: 30 pg (ref 26.0–34.0)
MCHC: 33 g/dL (ref 30.0–36.0)
MCV: 90.9 fL (ref 80.0–100.0)
Monocytes Absolute: 0.6 10*3/uL (ref 0.1–1.0)
Monocytes Relative: 6 %
Neutro Abs: 5.6 10*3/uL (ref 1.7–7.7)
Neutrophils Relative %: 51 %
Platelets: 394 10*3/uL (ref 150–400)
RBC: 4.83 MIL/uL (ref 3.87–5.11)
RDW: 13.8 % (ref 11.5–15.5)
WBC: 10.8 10*3/uL — ABNORMAL HIGH (ref 4.0–10.5)
nRBC: 0 % (ref 0.0–0.2)

## 2022-04-08 MED ORDER — LORAZEPAM 2 MG/ML IJ SOLN
1.0000 mg | Freq: Once | INTRAMUSCULAR | Status: AC
  Administered 2022-04-08: 1 mg via INTRAVENOUS
  Filled 2022-04-08: qty 1

## 2022-04-08 MED ORDER — FOSFOMYCIN TROMETHAMINE 3 G PO PACK
3.0000 g | PACK | Freq: Once | ORAL | Status: AC
  Administered 2022-04-08: 3 g via ORAL
  Filled 2022-04-08: qty 3

## 2022-04-08 MED ORDER — CYCLOBENZAPRINE HCL 5 MG PO TABS: 5.0000 mg | ORAL_TABLET | Freq: Three times a day (TID) | ORAL | 0 refills | Status: DC | PRN

## 2022-04-08 MED ORDER — CYCLOBENZAPRINE HCL 10 MG PO TABS
5.0000 mg | ORAL_TABLET | Freq: Once | ORAL | Status: AC
  Administered 2022-04-08: 5 mg via ORAL
  Filled 2022-04-08: qty 1

## 2022-04-08 NOTE — Discharge Instructions (Addendum)
Take Flexeril as needed for back pain and spasms.  You have been treated for a very mild UTI.  Return to the ER for worsening symptoms, persistent vomiting, difficulty breathing or other concerns.

## 2022-04-08 NOTE — ED Provider Notes (Signed)
[facility] Provider Note    Event Date/Time   First MD Initiated Contact with Patient 04/08/22 2118     (approximate)  History   Chief Complaint: Hypertension  HPI  Joyce Swanson is a 32 y.o. female with a past medical history of anxiety, asthma, depression, presents to the emergency department for high blood pressure and back pain.  According to the patient she has a history of severe anxiety, states she has had worsening anxiety recently.  States she has been experiencing over the last 3 to 4 weeks worsening pain in her back.  Patient is following up with her neurologist for this with no findings as of yet.  Patient states tonight she was also having a headache, states her anxiety became severe she checked her blood pressure and it was greater than [id number] systolic.  Patient was very concerned so she came to the emergency department for evaluation.  Patient denies any anterior chest pain denies any shortness of breath.  Denies any weakness or numbness.  Patient is very anxious in appearance during my evaluation.  Blood pressure initially 170/123 in the emergency department currently 152/111 prior to intervention.  Physical Exam   Triage Vital Signs: ED Triage Vitals  Enc Vitals Group     BP 04/08/22 2039 (!) 170/123     Pulse Rate 04/08/22 2039 (!) 110     Resp 04/08/22 2039 20     Temp 04/08/22 2039 99.2 F (37.3 C)     Temp Source 04/08/22 2039 Oral     SpO2 04/08/22 2039 95 %     Weight 04/08/22 2038 163 lb (73.9 kg)     Height 04/08/22 2038 5' 1"$  (1.549 m)     Head Circumference --      Peak Flow --      Pain Score 04/08/22 2038 10     Pain Loc --      Pain Edu? --      Excl. in [place]? --     Most recent vital signs: Vitals:   04/08/22 2039 04/08/22 2135  BP: (!) 170/123 (!) 152/111  Pulse: (!) 110 (!) 105  Resp: 20 (!) 22  Temp: 99.2 F (37.3 C)   SpO2: 95% 95%    General: Awake, quite anxious in appearance. CV:  Good peripheral perfusion.   Regular rate and rhythm  Resp:  Normal effort.  Equal breath sounds bilaterally.  Abd:  No distention.  Soft, nontender.  No rebound or guarding. Other:  Patient does have tenderness across the entire TMT spine.  No focal area of tenderness identified.   ED Results / Procedures / Treatments    MEDICATIONS ORDERED IN ED: Medications  LORazepam (ATIVAN) injection 1 mg (1 mg Intravenous Given 04/08/22 2133)     IMPRESSION / MDM / ASSESSMENT AND PLAN / ED COURSE  I reviewed the triage vital signs and the nursing notes.  Patient's presentation is most consistent with acute presentation with potential threat to life or bodily function.  Patient presents to the emergency department for hypertension and significant anxiety.  Here patient continues to appear anxious.  Blood pressure is down to 152/111 prior to intervention.  Lab work is reassuring with a normal CBC, reassuring chemistry with normal renal function.  No abdominal pain, no chest pain but does state pain in her back but this has been ongoing for the past 3 to 4 weeks.  Is currently following up with her doctor as well as neurologist.  Patient's workup is reassuring so far however given the patient's significant anxiety we will dose 1 mg of Ativan as well as IV fluids and reassess.  The patient is agreeable to this plan of care.  Patient is feeling much better.  Patient's pressure is down currently 136/101.  Continues to have back pain which she states has been ongoing for at least 3 weeks but the patient feels like it is getting worse and not better.  I discussed with the patient a trial of Flexeril to see if this helps the patient's discomfort but otherwise she will follow-up with her doctor regarding her back pain.  Patient is agreeable.  FINAL CLINICAL IMPRESSION(S) / ED DIAGNOSES   Hypertension Anxiety    Note:  This document was prepared using Dragon voice recognition software and may include unintentional dictation errors.    Harvest Dark, MD 04/15/22 [phone]

## 2022-04-08 NOTE — ED Triage Notes (Signed)
Check bp at home after having a bad headache for most of the day and it was 182/113 Has also been dealing with generalized upper back pain rated 10/10 for last couple weeks. Recent diagnosis of ICH.   Galcanezumab sent to pharmacy for management of migraines. Has been unable to receive since medications are on hold per pharmacy.

## 2022-04-09 DIAGNOSIS — F4312 Post-traumatic stress disorder, chronic: Secondary | ICD-10-CM | POA: Diagnosis not present

## 2022-04-13 DIAGNOSIS — M542 Cervicalgia: Secondary | ICD-10-CM | POA: Diagnosis not present

## 2022-04-13 DIAGNOSIS — F4312 Post-traumatic stress disorder, chronic: Secondary | ICD-10-CM | POA: Diagnosis not present

## 2022-04-13 DIAGNOSIS — F322 Major depressive disorder, single episode, severe without psychotic features: Secondary | ICD-10-CM | POA: Diagnosis not present

## 2022-04-13 DIAGNOSIS — F419 Anxiety disorder, unspecified: Secondary | ICD-10-CM | POA: Diagnosis not present

## 2022-04-13 DIAGNOSIS — M545 Low back pain, unspecified: Secondary | ICD-10-CM | POA: Diagnosis not present

## 2022-04-13 DIAGNOSIS — E1165 Type 2 diabetes mellitus with hyperglycemia: Secondary | ICD-10-CM | POA: Diagnosis not present

## 2022-04-13 DIAGNOSIS — G8929 Other chronic pain: Secondary | ICD-10-CM | POA: Diagnosis not present

## 2022-04-13 DIAGNOSIS — G932 Benign intracranial hypertension: Secondary | ICD-10-CM | POA: Diagnosis not present

## 2022-04-13 DIAGNOSIS — M7918 Myalgia, other site: Secondary | ICD-10-CM | POA: Diagnosis not present

## 2022-04-15 DIAGNOSIS — M542 Cervicalgia: Secondary | ICD-10-CM | POA: Diagnosis not present

## 2022-04-15 DIAGNOSIS — M545 Low back pain, unspecified: Secondary | ICD-10-CM | POA: Diagnosis not present

## 2022-04-15 DIAGNOSIS — R519 Headache, unspecified: Secondary | ICD-10-CM | POA: Diagnosis not present

## 2022-04-15 DIAGNOSIS — F4312 Post-traumatic stress disorder, chronic: Secondary | ICD-10-CM | POA: Diagnosis not present

## 2022-04-15 DIAGNOSIS — M546 Pain in thoracic spine: Secondary | ICD-10-CM | POA: Diagnosis not present

## 2022-04-20 ENCOUNTER — Ambulatory Visit: Payer: Medicaid Other | Admitting: *Deleted

## 2022-04-22 ENCOUNTER — Other Ambulatory Visit: Payer: Self-pay | Admitting: Gastroenterology

## 2022-04-22 DIAGNOSIS — K219 Gastro-esophageal reflux disease without esophagitis: Secondary | ICD-10-CM | POA: Diagnosis not present

## 2022-04-22 DIAGNOSIS — K7689 Other specified diseases of liver: Secondary | ICD-10-CM | POA: Diagnosis not present

## 2022-04-22 DIAGNOSIS — F4312 Post-traumatic stress disorder, chronic: Secondary | ICD-10-CM | POA: Diagnosis not present

## 2022-04-22 DIAGNOSIS — K200 Eosinophilic esophagitis: Secondary | ICD-10-CM | POA: Diagnosis not present

## 2022-04-22 DIAGNOSIS — K760 Fatty (change of) liver, not elsewhere classified: Secondary | ICD-10-CM | POA: Diagnosis not present

## 2022-04-23 DIAGNOSIS — M546 Pain in thoracic spine: Secondary | ICD-10-CM | POA: Diagnosis not present

## 2022-04-23 DIAGNOSIS — M545 Low back pain, unspecified: Secondary | ICD-10-CM | POA: Diagnosis not present

## 2022-04-23 DIAGNOSIS — M542 Cervicalgia: Secondary | ICD-10-CM | POA: Diagnosis not present

## 2022-04-23 DIAGNOSIS — R519 Headache, unspecified: Secondary | ICD-10-CM | POA: Diagnosis not present

## 2022-04-27 DIAGNOSIS — R519 Headache, unspecified: Secondary | ICD-10-CM | POA: Diagnosis not present

## 2022-04-27 DIAGNOSIS — M545 Low back pain, unspecified: Secondary | ICD-10-CM | POA: Diagnosis not present

## 2022-04-27 DIAGNOSIS — M542 Cervicalgia: Secondary | ICD-10-CM | POA: Diagnosis not present

## 2022-04-27 DIAGNOSIS — F4312 Post-traumatic stress disorder, chronic: Secondary | ICD-10-CM | POA: Diagnosis not present

## 2022-04-27 DIAGNOSIS — M546 Pain in thoracic spine: Secondary | ICD-10-CM | POA: Diagnosis not present

## 2022-04-29 DIAGNOSIS — F4312 Post-traumatic stress disorder, chronic: Secondary | ICD-10-CM | POA: Diagnosis not present

## 2022-05-03 ENCOUNTER — Encounter: Payer: Self-pay | Admitting: Internal Medicine

## 2022-05-04 ENCOUNTER — Ambulatory Visit: Payer: Medicaid Other | Admitting: *Deleted

## 2022-05-04 DIAGNOSIS — L600 Ingrowing nail: Secondary | ICD-10-CM | POA: Diagnosis not present

## 2022-05-04 DIAGNOSIS — M79674 Pain in right toe(s): Secondary | ICD-10-CM | POA: Diagnosis not present

## 2022-05-04 DIAGNOSIS — F4312 Post-traumatic stress disorder, chronic: Secondary | ICD-10-CM | POA: Diagnosis not present

## 2022-05-04 DIAGNOSIS — M79675 Pain in left toe(s): Secondary | ICD-10-CM | POA: Diagnosis not present

## 2022-05-06 DIAGNOSIS — F4312 Post-traumatic stress disorder, chronic: Secondary | ICD-10-CM | POA: Diagnosis not present

## 2022-05-11 DIAGNOSIS — F4312 Post-traumatic stress disorder, chronic: Secondary | ICD-10-CM | POA: Diagnosis not present

## 2022-05-12 ENCOUNTER — Encounter: Payer: Medicaid Other | Attending: Student | Admitting: Dietician

## 2022-05-12 ENCOUNTER — Encounter: Payer: Self-pay | Admitting: Dietician

## 2022-05-12 VITALS — Ht 61.0 in | Wt 165.0 lb

## 2022-05-12 DIAGNOSIS — E669 Obesity, unspecified: Secondary | ICD-10-CM | POA: Insufficient documentation

## 2022-05-12 DIAGNOSIS — Z6831 Body mass index (BMI) 31.0-31.9, adult: Secondary | ICD-10-CM | POA: Diagnosis not present

## 2022-05-12 DIAGNOSIS — Z713 Dietary counseling and surveillance: Secondary | ICD-10-CM | POA: Insufficient documentation

## 2022-05-12 DIAGNOSIS — E119 Type 2 diabetes mellitus without complications: Secondary | ICD-10-CM | POA: Insufficient documentation

## 2022-05-12 NOTE — Patient Instructions (Addendum)
Aim for 150 minutes of physical activity weekly. Make physical activity a part of your week. Try to include at least 30 minutes of physical activity 5 days each week or at least 150 minutes per week. Regular physical activity promotes overall health-including helping to reduce risk for heart disease and diabetes, promoting mental health, and helping [place] sleep better.     Goal: Aim to make your plate look like the Diabetes Plate 2x/day (1/2 plate vegetables, 1/4 protein, 1/4 carb).   Goal: Put together some to-go snacks that include a carb and protein.   Rethink what you drink. Choose beverages without added sugar. Look for 0 carbs on the label. Aim to eat within 1-2 hours of waking up and every 3-5 hours following.  When snacking aim to include a carbohydrate and protein.

## 2022-05-12 NOTE — Progress Notes (Signed)
Diabetes Self-Management Education  Visit Type:  First/Initial  Appt. Start Time: 0830 Appt. End Time: [id number]  05/12/2022  Ms. Kanyon Arbuthnot, identified by name and date of birth, is a 32 y.o. female with a diagnosis of Diabetes: Type 2.    ASSESSMENT  Primary concern: Pt states she is concerned about how she can best control her blood sugar.   History includes: anemia, anxiety, asthma, depression, type 2 diabetes, sleep apnea.  Labs noted: reviewed Medications include: metformin Supplements: aztec root, vitamin D  Pt states she has a lot of family history of diabetes. Pt reports she witnessed her dad deal with diabetic complications and wants to prevent this.  Pt reports she is not working right now. 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   Pt does the shopping and cooking.  Pt reports she has a lot of anxiety.   Pt states she recently had surgery to get her ingrown toe nail removed.   Pt states she has history of low blood sugar. She states she occasionally wakes up low and her blood glucose is '60mg'$ /dL. She reports she will eat a banana to bring it up.   Pt states she has nerve issues going on with her back so exercise has been difficult but she has been trying to exercise 3 days per week for 15-30 minutes.   Height '5\' 1"'$  (1.549 m), weight 165 lb (74.8 kg), last menstrual period 05/27/2020. Body mass index is 31.18 kg/m.    Diabetes Self-Management Education - 05/12/22 0823       Health Coping   How would you rate your overall health? Good;Fair      Psychosocial Assessment   Patient Belief/Attitude about Diabetes Motivated to manage diabetes    What is the hardest part about your diabetes right now, causing you the most concern, or is the most worrisome to you about your diabetes?   Making healty food and beverage choices    Self-care barriers None    Self-management support Doctor's office    Patient Concerns Nutrition/Meal  planning    Special Needs None    Preferred Learning Style No preference indicated    Learning Readiness Ready      Pre-Education Assessment   Patient understands the diabetes disease and treatment process. Needs Instruction    Patient understands incorporating nutritional management into lifestyle. Needs Instruction    Patient undertands incorporating physical activity into lifestyle. Needs Instruction    Patient understands using medications safely. Needs Instruction    Patient understands monitoring blood glucose, interpreting and using results Needs Instruction    Patient understands prevention, detection, and treatment of acute complications. Needs Instruction    Patient understands prevention, detection, and treatment of chronic complications. Needs Instruction    Patient understands how to develop strategies to address psychosocial issues. Needs Instruction    Patient understands how to develop strategies to promote health/change behavior. Needs Instruction      Complications   How often do you check your blood sugar? 3-4 times / week   once per week   Postprandial Blood glucose range (mg/dL) 130-179    Have you had a dilated eye exam in the past 12 months? Yes    Have you had a dental exam in the past 12 months? No    Are you checking your feet? Yes      Dietary Intake   Breakfast Eggs on low carb or whole wheat tortillas  Snack (morning) Low fat cheese and pepperoni OR fruit    Lunch sandwich with [place] or ham, with lettuce and tomato    Snack (afternoon) none    Dinner steak OR salmon OR pork chop with 2 vegetables (brussels, corn, broccoli, etc)    Snack (evening) low carb ice cream or popcicles    Beverage(s) starbucks double shot can coffee, water 2-3 bottles, 2 % milk      Activity / Exercise   Activity / Exercise Type Light (walking / raking leaves)    How many days per week do you exercise? 3    How many minutes per day do you exercise? 20    Total minutes per  week of exercise 60      Patient Education   Previous Diabetes Education No    Disease Pathophysiology Definition of diabetes, type 1 and 2, and the diagnosis of diabetes;Factors that contribute to the development of diabetes;Explored patient's options for treatment of their diabetes    Healthy Eating Role of diet in the treatment of diabetes and the relationship between the three main macronutrients and blood glucose level;Plate Method;Reviewed blood glucose goals for pre and post meals and how to evaluate the patients' food intake on their blood glucose level.;Meal timing in regards to the patients' current diabetes medication.;Information on hints to eating out and maintain blood glucose control.;Meal options for control of blood glucose level and chronic complications.    Being Active Role of exercise on diabetes management, blood pressure control and cardiac health.;Helped patient identify appropriate exercises in relation to his/her diabetes, diabetes complications and other health issue.    Medications Reviewed patients medication for diabetes, action, purpose, timing of dose and side effects.;Reviewed medication adjustment guidelines for hyperglycemia and sick days.    Monitoring Identified appropriate SMBG and/or A1C goals.;Daily foot exams;Yearly dilated eye exam    Acute complications Discussed and identified patients' prevention, symptoms, and treatment of hyperglycemia.;Taught prevention, symptoms, and  treatment of hypoglycemia - the 15 rule.    Chronic complications Relationship between chronic complications and blood glucose control;Assessed and discussed foot care and prevention of foot problems;Lipid levels, blood glucose control and heart disease;Identified and discussed with patient  current chronic complications;Retinopathy and reason for yearly dilated eye exams;Dental care;Nephropathy, what it is, prevention of, the use of ACE, ARB's and early detection of through urine  microalbumia.;Reviewed with patient heart disease, higher risk of, and prevention    Diabetes Stress and Support Identified and addressed patients feelings and concerns about diabetes;Worked with patient to identify barriers to care and solutions;Role of stress on diabetes    Lifestyle and Health Coping Lifestyle issues that need to be addressed for better diabetes care;Review risk of smoking and offered smoking cessation      Individualized Goals (developed by patient)   Nutrition General guidelines for healthy choices and portions discussed    Physical Activity Exercise 3-5 times per week;30 minutes per day    Medications take my medication as prescribed    Monitoring  Test my blood glucose as discussed    Problem Solving Eating Pattern    Reducing Risk examine blood glucose patterns;stop smoking;do foot checks daily;treat hypoglycemia with 15 grams of carbs if blood glucose less than '70mg'$ /dL    Health Coping Ask for help with psychological, social, or emotional issues      Post-Education Assessment   Patient understands the diabetes disease and treatment process. Comprehends key points    Patient understands incorporating nutritional management into lifestyle. Comprehends  key points    Patient undertands incorporating physical activity into lifestyle. Comprehends key points    Patient understands using medications safely. Comphrehends key points    Patient understands monitoring blood glucose, interpreting and using results Comprehends key points    Patient understands prevention, detection, and treatment of acute complications. Comprehends key points    Patient understands prevention, detection, and treatment of chronic complications. Comprehends key points    Patient understands how to develop strategies to address psychosocial issues. Comprehends key points    Patient understands how to develop strategies to promote health/change behavior. Comprehends key points      Outcomes   Program  Status Not Completed             Learning Objective:  Patient will have a greater understanding of diabetes self-management. Patient education plan is to attend individual and/or group sessions per assessed needs and concerns.   Plan:   Patient Instructions  Aim for 150 minutes of physical activity weekly. Make physical activity a part of your week. Try to include at least 30 minutes of physical activity 5 days each week or at least 150 minutes per week. Regular physical activity promotes overall health-including helping to reduce risk for heart disease and diabetes, promoting mental health, and helping [place] sleep better.     Goal: Aim to make your plate look like the Diabetes Plate 2x/day (1/2 plate vegetables, 1/4 protein, 1/4 carb).   Goal: Put together some to-go snacks that include a carb and protein.   Rethink what you drink. Choose beverages without added sugar. Look for 0 carbs on the label. Aim to eat within 1-2 hours of waking up and every 3-5 hours following.  When snacking aim to include a carbohydrate and protein.    Expected Outcomes:  Demonstrated interest in learning. Expect positive outcomes  Education material provided: ADA - How to Thrive: A Guide for Your Journey with Diabetes, Meal plan card, and Snack sheet  If problems or questions, patient to contact team via:  Phone  Future DSME appointment: - 2 months

## 2022-05-13 DIAGNOSIS — F4312 Post-traumatic stress disorder, chronic: Secondary | ICD-10-CM | POA: Diagnosis not present

## 2022-05-17 DIAGNOSIS — M7918 Myalgia, other site: Secondary | ICD-10-CM | POA: Diagnosis not present

## 2022-05-17 DIAGNOSIS — R945 Abnormal results of liver function studies: Secondary | ICD-10-CM | POA: Diagnosis not present

## 2022-05-17 DIAGNOSIS — R202 Paresthesia of skin: Secondary | ICD-10-CM | POA: Diagnosis not present

## 2022-05-17 DIAGNOSIS — G932 Benign intracranial hypertension: Secondary | ICD-10-CM | POA: Diagnosis not present

## 2022-05-17 DIAGNOSIS — E119 Type 2 diabetes mellitus without complications: Secondary | ICD-10-CM | POA: Diagnosis not present

## 2022-05-17 DIAGNOSIS — M2550 Pain in unspecified joint: Secondary | ICD-10-CM | POA: Diagnosis not present

## 2022-05-17 DIAGNOSIS — R200 Anesthesia of skin: Secondary | ICD-10-CM | POA: Diagnosis not present

## 2022-05-17 DIAGNOSIS — G43809 Other migraine, not intractable, without status migrainosus: Secondary | ICD-10-CM | POA: Diagnosis not present

## 2022-05-17 DIAGNOSIS — E785 Hyperlipidemia, unspecified: Secondary | ICD-10-CM | POA: Diagnosis not present

## 2022-05-18 DIAGNOSIS — F4312 Post-traumatic stress disorder, chronic: Secondary | ICD-10-CM | POA: Diagnosis not present

## 2022-05-25 DIAGNOSIS — F4312 Post-traumatic stress disorder, chronic: Secondary | ICD-10-CM | POA: Diagnosis not present

## 2022-05-26 DIAGNOSIS — M79675 Pain in left toe(s): Secondary | ICD-10-CM | POA: Diagnosis not present

## 2022-05-26 DIAGNOSIS — M79674 Pain in right toe(s): Secondary | ICD-10-CM | POA: Diagnosis not present

## 2022-05-26 DIAGNOSIS — L600 Ingrowing nail: Secondary | ICD-10-CM | POA: Diagnosis not present

## 2022-05-27 ENCOUNTER — Inpatient Hospital Stay: Admission: RE | Admit: 2022-05-27 | Payer: Medicaid Other | Source: Ambulatory Visit

## 2022-05-27 DIAGNOSIS — F4312 Post-traumatic stress disorder, chronic: Secondary | ICD-10-CM | POA: Diagnosis not present

## 2022-05-31 ENCOUNTER — Encounter: Payer: Self-pay | Admitting: Obstetrics and Gynecology

## 2022-06-01 DIAGNOSIS — M546 Pain in thoracic spine: Secondary | ICD-10-CM | POA: Diagnosis not present

## 2022-06-01 DIAGNOSIS — F4312 Post-traumatic stress disorder, chronic: Secondary | ICD-10-CM | POA: Diagnosis not present

## 2022-06-01 DIAGNOSIS — M542 Cervicalgia: Secondary | ICD-10-CM | POA: Diagnosis not present

## 2022-06-01 DIAGNOSIS — M545 Low back pain, unspecified: Secondary | ICD-10-CM | POA: Diagnosis not present

## 2022-06-01 DIAGNOSIS — R519 Headache, unspecified: Secondary | ICD-10-CM | POA: Diagnosis not present

## 2022-06-08 ENCOUNTER — Other Ambulatory Visit: Payer: Self-pay | Admitting: Student

## 2022-06-08 DIAGNOSIS — G932 Benign intracranial hypertension: Secondary | ICD-10-CM

## 2022-06-08 DIAGNOSIS — F4312 Post-traumatic stress disorder, chronic: Secondary | ICD-10-CM | POA: Diagnosis not present

## 2022-06-08 DIAGNOSIS — M7918 Myalgia, other site: Secondary | ICD-10-CM

## 2022-06-10 DIAGNOSIS — F4312 Post-traumatic stress disorder, chronic: Secondary | ICD-10-CM | POA: Diagnosis not present

## 2022-06-16 DIAGNOSIS — F4312 Post-traumatic stress disorder, chronic: Secondary | ICD-10-CM | POA: Diagnosis not present

## 2022-06-21 ENCOUNTER — Ambulatory Visit
Admission: RE | Admit: 2022-06-21 | Discharge: 2022-06-21 | Disposition: A | Discharge status: Home / Self Care | Payer: Medicaid Other | Source: Ambulatory Visit | Attending: Gastroenterology | Admitting: Gastroenterology

## 2022-06-21 DIAGNOSIS — G932 Benign intracranial hypertension: Secondary | ICD-10-CM | POA: Diagnosis not present

## 2022-06-21 DIAGNOSIS — K7689 Other specified diseases of liver: Secondary | ICD-10-CM

## 2022-06-21 DIAGNOSIS — K760 Fatty (change of) liver, not elsewhere classified: Secondary | ICD-10-CM | POA: Diagnosis not present

## 2022-06-21 DIAGNOSIS — Z6829 Body mass index (BMI) 29.0-29.9, adult: Secondary | ICD-10-CM | POA: Diagnosis not present

## 2022-06-21 MED ORDER — GADOPICLENOL 0.5 MMOL/ML IV SOLN
9.0000 mL | Freq: Once | INTRAVENOUS | Status: AC | PRN
  Administered 2022-06-21: 9 mL via INTRAVENOUS

## 2022-06-22 ENCOUNTER — Other Ambulatory Visit (HOSPITAL_COMMUNITY): Payer: Self-pay | Admitting: Neurosurgery

## 2022-06-22 ENCOUNTER — Other Ambulatory Visit: Payer: Self-pay | Admitting: Neurosurgery

## 2022-06-22 DIAGNOSIS — F4312 Post-traumatic stress disorder, chronic: Secondary | ICD-10-CM | POA: Diagnosis not present

## 2022-06-22 DIAGNOSIS — G932 Benign intracranial hypertension: Secondary | ICD-10-CM

## 2022-06-23 DIAGNOSIS — R200 Anesthesia of skin: Secondary | ICD-10-CM | POA: Diagnosis not present

## 2022-06-23 DIAGNOSIS — R202 Paresthesia of skin: Secondary | ICD-10-CM | POA: Diagnosis not present

## 2022-06-24 DIAGNOSIS — F4312 Post-traumatic stress disorder, chronic: Secondary | ICD-10-CM | POA: Diagnosis not present

## 2022-06-25 NOTE — Progress Notes (Signed)
Surgical Instructions    Your procedure is scheduled on Thursday, 07/08/22.  Report to [facility] Main Entrance "A" at 11:00 A.M., then check in with the Admitting office.  Call this number if you have problems the morning of surgery:  [phone]   If you have any questions prior to your surgery date call [phone]: Open Monday-Friday 8am-4pm If you experience any cold or flu symptoms such as cough, fever, chills, shortness of breath, etc. between now and your scheduled surgery, please notify us at the above number     Remember:  Do not eat or drink after midnight the night before your surgery     Take these medicines the morning of surgery with A SIP OF WATER:  atorvastatin (LIPITOR)  pantoprazole (PROTONIX)   IF NEEDED: albuterol (VENTOLIN HFA) inhaler- bring with you the day of surgery diazepam (VALIUM)   As of today, STOP taking any Aspirin (unless otherwise instructed by your surgeon) Aleve, Naproxen, Ibuprofen, Motrin, Advil, Goody's, BC's, all herbal medications, fish oil, and all vitamins.  WHAT DO I DO ABOUT MY DIABETES MEDICATION?   Do not take oral diabetes medicines (pills) the morning of surgery.  THE MORNING OF SURGERY, do not take metFORMIN (GLUCOPHAGE) the day of surgery.  The day of surgery, do not take other diabetes injectables, including Byetta (exenatide), Bydureon (exenatide ER), Victoza (liraglutide), or Trulicity (dulaglutide).  If your CBG is greater than 220 mg/dL, you may take  of your sliding scale (correction) dose of insulin.   HOW TO MANAGE YOUR DIABETES BEFORE AND AFTER SURGERY  Why is it important to control my blood sugar before and after surgery? Improving blood sugar levels before and after surgery helps healing and can limit problems. A way of improving blood sugar control is eating a healthy diet by:  Eating less sugar and carbohydrates  Increasing activity/exercise  Talking with your doctor about reaching your blood sugar  goals High blood sugars (greater than 180 mg/dL) can raise your risk of infections and slow your recovery, so you will need to focus on controlling your diabetes during the weeks before surgery. Make sure that the doctor who takes care of your diabetes knows about your planned surgery including the date and location.  How do I manage my blood sugar before surgery? Check your blood sugar at least 4 times a day, starting 2 days before surgery, to make sure that the level is not too high or low.  Check your blood sugar the morning of your surgery when you wake up and every 2 hours until you get to the Short Stay unit.  If your blood sugar is less than 70 mg/dL, you will need to treat for low blood sugar: Do not take insulin. Treat a low blood sugar (less than 70 mg/dL) with  cup of clear juice (cranberry or apple), 4 glucose tablets, OR glucose gel. Recheck blood sugar in 15 minutes after treatment (to make sure it is greater than 70 mg/dL). If your blood sugar is not greater than 70 mg/dL on recheck, call [phone] for further instructions. Report your blood sugar to the short stay nurse when you get to Short Stay.  If you are admitted to the hospital after surgery: Your blood sugar will be checked by the staff and you will probably be given insulin after surgery (instead of oral diabetes medicines) to make sure you have good blood sugar levels. The goal for blood sugar control after surgery is 80-180 mg/dL.  Do not wear jewelry or makeup. Do not wear lotions, powders, perfumes or deodorant. Do not shave 48 hours prior to surgery.   Do not bring valuables to the hospital. Do not wear nail polish, gel polish, artificial nails, or any other type of covering on natural nails (fingers and toes) If you have artificial nails or gel coating that need to be removed by a nail salon, please have this removed prior to surgery. Artificial nails or gel coating may interfere with anesthesia's  ability to adequately monitor your vital signs.  [facility] is not responsible for any belongings or valuables.    Do NOT Smoke (Tobacco/Vaping)  24 hours prior to your procedure  If you use a CPAP at night, you may bring your mask for your overnight stay.   Contacts, glasses, hearing aids, dentures or partials may not be worn into surgery, please bring cases for these belongings   For patients admitted to the hospital, discharge time will be determined by your treatment team.   Patients discharged the day of surgery will not be allowed to drive home, and someone needs to stay with them for 24 hours.   SURGICAL WAITING ROOM VISITATION Patients having surgery or a procedure may have no more than 2 support people in the waiting area - these visitors may rotate.   Children under the age of 76 must have an adult with them who is not the patient. If the patient needs to stay at the hospital during part of their recovery, the visitor guidelines for inpatient rooms apply. Pre-op nurse will coordinate an appropriate time for 1 support person to accompany patient in pre-op.  This support person may not rotate.   Please refer to [email or web address] for the visitor guidelines for Inpatients (after your surgery is over and you are in a regular room).    Special instructions:    Oral Hygiene is also important to reduce your risk of infection.  Remember - BRUSH YOUR TEETH THE MORNING OF SURGERY WITH YOUR REGULAR TOOTHPASTE   [facility]- Preparing For Surgery  Before surgery, you can play an important role. Because skin is not sterile, your skin needs to be as free of germs as possible. You can reduce the number of germs on your skin by washing with CHG (chlorahexidine gluconate) Soap before surgery.  CHG is an antiseptic cleaner which kills germs and bonds with the skin to continue killing germs even after washing.     Please do not use if you  have an allergy to CHG or antibacterial soaps. If your skin becomes reddened/irritated stop using the CHG.  Do not shave (including legs and underarms) for at least 48 hours prior to first CHG shower. It is OK to shave your face.  Please follow these instructions carefully.     Shower the NIGHT BEFORE SURGERY and the MORNING OF SURGERY with CHG Soap.   If you chose to wash your hair, wash your hair first as usual with your normal shampoo. After you shampoo, rinse your hair and body thoroughly to remove the shampoo.  Then [employer] and genitals (private parts) with your normal soap and rinse thoroughly to remove soap.  After that Use CHG Soap as you would any other liquid soap. You can apply CHG directly to the skin and wash gently with a scrungie or a clean washcloth.   Apply the CHG Soap to your body ONLY FROM THE NECK DOWN.  Do not use on open wounds or open sores.  Avoid contact with your eyes, ears, mouth and genitals (private parts). Wash Face and genitals (private parts)  with your normal soap.   Wash thoroughly, paying special attention to the area where your surgery will be performed.  Thoroughly rinse your body with warm water from the neck down.  DO NOT shower/wash with your normal soap after using and rinsing off the CHG Soap.  Pat yourself dry with a CLEAN TOWEL.  Wear CLEAN PAJAMAS to bed the night before surgery  Place CLEAN SHEETS on your bed the night before your surgery  DO NOT SLEEP WITH PETS.   Day of Surgery: Take a shower with CHG soap. Wear Clean/Comfortable clothing the morning of surgery Do not apply any deodorants/lotions.   Remember to brush your teeth WITH YOUR REGULAR TOOTHPASTE.    If you received a COVID test during your pre-op visit, it is requested that you wear a mask when out in public, stay away from anyone that may not be feeling well, and notify your surgeon if you develop symptoms. If you have been in contact with anyone that has tested  positive in the last 10 days, please notify your surgeon.    Please read over the following fact sheets that you were given.

## 2022-06-28 ENCOUNTER — Inpatient Hospital Stay (HOSPITAL_COMMUNITY)
Admission: RE | Admit: 2022-06-28 | Discharge: 2022-06-28 | Disposition: A | Discharge status: Home / Self Care | Payer: Medicaid Other | Source: Ambulatory Visit

## 2022-06-28 NOTE — Progress Notes (Signed)
Pt forgot about the PAT appt today. Dondra Spry notified of need to reschedule.

## 2022-06-29 DIAGNOSIS — F4312 Post-traumatic stress disorder, chronic: Secondary | ICD-10-CM | POA: Diagnosis not present

## 2022-07-01 DIAGNOSIS — F4312 Post-traumatic stress disorder, chronic: Secondary | ICD-10-CM | POA: Diagnosis not present

## 2022-07-02 ENCOUNTER — Encounter (HOSPITAL_COMMUNITY)
Admission: RE | Admit: 2022-07-02 | Discharge: 2022-07-02 | Disposition: A | Discharge status: Home / Self Care | Payer: Medicaid Other | Source: Ambulatory Visit | Attending: Neurosurgery | Admitting: Neurosurgery

## 2022-07-02 ENCOUNTER — Encounter (HOSPITAL_COMMUNITY): Payer: Self-pay

## 2022-07-02 ENCOUNTER — Other Ambulatory Visit: Payer: Self-pay

## 2022-07-02 VITALS — BP 123/91 | HR 85 | Temp 98.6°F | Resp 18 | Ht 61.0 in | Wt 155.2 lb

## 2022-07-02 DIAGNOSIS — E119 Type 2 diabetes mellitus without complications: Secondary | ICD-10-CM | POA: Diagnosis not present

## 2022-07-02 DIAGNOSIS — Z01812 Encounter for preprocedural laboratory examination: Secondary | ICD-10-CM | POA: Insufficient documentation

## 2022-07-02 DIAGNOSIS — K760 Fatty (change of) liver, not elsewhere classified: Secondary | ICD-10-CM | POA: Insufficient documentation

## 2022-07-02 HISTORY — DX: Headache, unspecified: R51.9

## 2022-07-02 HISTORY — DX: Post-traumatic stress disorder, unspecified: F43.10

## 2022-07-02 HISTORY — DX: Obsessive-compulsive disorder, unspecified: F42.9

## 2022-07-02 HISTORY — DX: Benign intracranial hypertension: G93.2

## 2022-07-02 HISTORY — DX: Fatty (change of) liver, not elsewhere classified: K76.0

## 2022-07-02 HISTORY — DX: Attention-deficit hyperactivity disorder, unspecified type: F90.9

## 2022-07-02 LAB — CBC WITH DIFFERENTIAL/PLATELET
Abs Immature Granulocytes: 0.08 10*3/uL — ABNORMAL HIGH (ref 0.00–0.07)
Basophils Absolute: 0.1 10*3/uL (ref 0.0–0.1)
Basophils Relative: 0 %
Eosinophils Absolute: 0.5 10*3/uL (ref 0.0–0.5)
Eosinophils Relative: 4 %
HCT: 41 % (ref 36.0–46.0)
Hemoglobin: 13.7 g/dL (ref 12.0–15.0)
Immature Granulocytes: 1 %
Lymphocytes Relative: 30 %
Lymphs Abs: 3.7 10*3/uL (ref 0.7–4.0)
MCH: 29.9 pg (ref 26.0–34.0)
MCHC: 33.4 g/dL (ref 30.0–36.0)
MCV: 89.5 fL (ref 80.0–100.0)
Monocytes Absolute: 0.9 10*3/uL (ref 0.1–1.0)
Monocytes Relative: 7 %
Neutro Abs: 7.2 10*3/uL (ref 1.7–7.7)
Neutrophils Relative %: 58 %
Platelets: 407 10*3/uL — ABNORMAL HIGH (ref 150–400)
RBC: 4.58 MIL/uL (ref 3.87–5.11)
RDW: 13.7 % (ref 11.5–15.5)
WBC: 12.4 10*3/uL — ABNORMAL HIGH (ref 4.0–10.5)
nRBC: 0 % (ref 0.0–0.2)

## 2022-07-02 LAB — COMPREHENSIVE METABOLIC PANEL
ALT: 53 U/L — ABNORMAL HIGH (ref 0–44)
AST: 32 U/L (ref 15–41)
Albumin: 4.3 g/dL (ref 3.5–5.0)
Alkaline Phosphatase: 89 U/L (ref 38–126)
Anion gap: 10 (ref 5–15)
BUN: 11 mg/dL (ref 6–20)
CO2: 26 mmol/L (ref 22–32)
Calcium: 9.7 mg/dL (ref 8.9–10.3)
Chloride: 100 mmol/L (ref 98–111)
Creatinine, Ser: 0.63 mg/dL (ref 0.44–1.00)
GFR, Estimated: >60 mL/min (ref >60)
Glucose, Bld: 101 mg/dL — ABNORMAL HIGH (ref 70–99)
Potassium: 4.2 mmol/L (ref 3.5–5.1)
Sodium: 136 mmol/L (ref 135–145)
Total Bilirubin: 0.5 mg/dL (ref 0.3–1.2)
Total Protein: 7.7 g/dL (ref 6.5–8.1)

## 2022-07-02 LAB — HEMOGLOBIN A1C
Hgb A1c MFr Bld: 6.2 % — ABNORMAL HIGH (ref 4.8–5.6)
Mean Plasma Glucose: 131.24 mg/dL

## 2022-07-02 LAB — URINALYSIS, ROUTINE W REFLEX MICROSCOPIC
Bilirubin Urine: NEGATIVE
Glucose, UA: NEGATIVE mg/dL
Hgb urine dipstick: NEGATIVE
Ketones, ur: NEGATIVE mg/dL
Leukocytes,Ua: NEGATIVE
Nitrite: NEGATIVE
Protein, ur: NEGATIVE mg/dL
Specific Gravity, Urine: 1.011 (ref 1.005–1.030)
pH: 6 (ref 5.0–8.0)

## 2022-07-02 LAB — APTT: aPTT: 29 seconds (ref 24–36)

## 2022-07-02 LAB — GLUCOSE, CAPILLARY: Glucose-Capillary: 114 mg/dL — ABNORMAL HIGH (ref 70–99)

## 2022-07-02 LAB — PROTIME-INR
INR: 1 (ref 0.8–1.2)
Prothrombin Time: 12.9 seconds (ref 11.4–15.2)

## 2022-07-02 NOTE — Progress Notes (Signed)
PCP - Dr. Nadyne Coombes Cardiologist - denies  PPM/ICD - denies   Chest x-ray - 05/10/20 EKG - 03/26/22 Stress Test - denies ECHO - denies Cardiac Cath - denies  Sleep Study - denies   Fasting Blood Sugar - 80-100 Pt states that she just "spot checks" her CBG as needed (if she feels like it is low)  Last dose of GLP1 agonist-  n/a   ASA/Blood Thinner Instructions: n/a   ERAS Protcol - no, NPO   COVID TEST- n/a   Anesthesia review: no  Patient denies shortness of breath, fever, cough and chest pain at PAT appointment   All instructions explained to the patient, with a verbal understanding of the material. Patient agrees to go over the instructions while at home for a better understanding.  The opportunity to ask questions was provided.

## 2022-07-06 DIAGNOSIS — F4312 Post-traumatic stress disorder, chronic: Secondary | ICD-10-CM | POA: Diagnosis not present

## 2022-07-07 DIAGNOSIS — R102 Pelvic and perineal pain: Secondary | ICD-10-CM | POA: Diagnosis not present

## 2022-07-07 DIAGNOSIS — N941 Unspecified dyspareunia: Secondary | ICD-10-CM | POA: Diagnosis not present

## 2022-07-08 ENCOUNTER — Ambulatory Visit (HOSPITAL_BASED_OUTPATIENT_CLINIC_OR_DEPARTMENT_OTHER): Payer: Medicaid Other | Admitting: Anesthesiology

## 2022-07-08 ENCOUNTER — Encounter (HOSPITAL_COMMUNITY): Payer: Self-pay | Admitting: Neurosurgery

## 2022-07-08 ENCOUNTER — Ambulatory Visit (HOSPITAL_COMMUNITY)
Admission: RE | Admit: 2022-07-08 | Discharge: 2022-07-08 | Disposition: A | Discharge status: Home / Self Care | Payer: Medicaid Other | Source: Ambulatory Visit | Attending: Neurosurgery | Admitting: Neurosurgery

## 2022-07-08 ENCOUNTER — Encounter (HOSPITAL_COMMUNITY)
Admission: RE | Disposition: A | Discharge status: Home / Self Care | Payer: Self-pay | Source: Home / Self Care | Attending: Neurosurgery

## 2022-07-08 ENCOUNTER — Ambulatory Visit (HOSPITAL_COMMUNITY): Payer: Medicaid Other | Admitting: Anesthesiology

## 2022-07-08 ENCOUNTER — Ambulatory Visit (HOSPITAL_COMMUNITY)
Admission: RE | Admit: 2022-07-08 | Discharge: 2022-07-08 | Disposition: A | Discharge status: Home / Self Care | Payer: Medicaid Other | Attending: Neurosurgery | Admitting: Neurosurgery

## 2022-07-08 ENCOUNTER — Other Ambulatory Visit: Payer: Self-pay

## 2022-07-08 DIAGNOSIS — G932 Benign intracranial hypertension: Secondary | ICD-10-CM | POA: Insufficient documentation

## 2022-07-08 DIAGNOSIS — E119 Type 2 diabetes mellitus without complications: Secondary | ICD-10-CM

## 2022-07-08 DIAGNOSIS — I871 Compression of vein: Secondary | ICD-10-CM | POA: Diagnosis not present

## 2022-07-08 DIAGNOSIS — F17210 Nicotine dependence, cigarettes, uncomplicated: Secondary | ICD-10-CM

## 2022-07-08 DIAGNOSIS — J449 Chronic obstructive pulmonary disease, unspecified: Secondary | ICD-10-CM

## 2022-07-08 HISTORY — PX: RADIOLOGY WITH ANESTHESIA: SHX6223

## 2022-07-08 HISTORY — PX: IR ANGIO INTRA EXTRACRAN SEL COM CAROTID INNOMINATE UNI R MOD SED: IMG5359

## 2022-07-08 HISTORY — PX: IR ANGIO EXTERNAL CAROTID SEL EXT CAROTID UNI R MOD SED: IMG5371

## 2022-07-08 LAB — GLUCOSE, CAPILLARY
Glucose-Capillary: 129 mg/dL — ABNORMAL HIGH (ref 70–99)
Glucose-Capillary: 108 mg/dL — ABNORMAL HIGH (ref 70–99)
Glucose-Capillary: 123 mg/dL — ABNORMAL HIGH (ref 70–99)

## 2022-07-08 SURGERY — IR WITH ANESTHESIA
Anesthesia: General

## 2022-07-08 MED ORDER — CHLORHEXIDINE GLUCONATE CLOTH 2 % EX PADS: 6.0000 | MEDICATED_PAD | Freq: Once | CUTANEOUS | Status: DC

## 2022-07-08 MED ORDER — ROCURONIUM BROMIDE 10 MG/ML (PF) SYRINGE
PREFILLED_SYRINGE | INTRAVENOUS | Status: DC | PRN
  Administered 2022-07-08 (×2): 50 mg via INTRAVENOUS

## 2022-07-08 MED ORDER — DEXAMETHASONE SODIUM PHOSPHATE 4 MG/ML IJ SOLN
INTRAMUSCULAR | Status: DC | PRN
  Administered 2022-07-08: 10 mg via INTRAVENOUS

## 2022-07-08 MED ORDER — HYDROCODONE-ACETAMINOPHEN 5-325 MG PO TABS: 1.0000 | ORAL_TABLET | ORAL | Status: DC | PRN

## 2022-07-08 MED ORDER — PROPOFOL 10 MG/ML IV BOLUS
INTRAVENOUS | Status: AC
  Filled 2022-07-08: qty 20

## 2022-07-08 MED ORDER — HEPARIN SODIUM (PORCINE) 1000 UNIT/ML IJ SOLN
INTRAMUSCULAR | Status: DC | PRN
  Administered 2022-07-08: 2000 [IU] via INTRAVENOUS

## 2022-07-08 MED ORDER — CHLORHEXIDINE GLUCONATE 0.12 % MT SOLN: 15.0000 mL | Freq: Once | OROMUCOSAL | Status: AC

## 2022-07-08 MED ORDER — LACTATED RINGERS IV SOLN: INTRAVENOUS | Status: DC

## 2022-07-08 MED ORDER — MIDAZOLAM HCL 2 MG/2ML IJ SOLN
INTRAMUSCULAR | Status: AC
  Filled 2022-07-08: qty 2

## 2022-07-08 MED ORDER — CEFAZOLIN SODIUM-DEXTROSE 2-4 GM/100ML-% IV SOLN
2.0000 g | INTRAVENOUS | Status: AC
  Administered 2022-07-08: 2 g via INTRAVENOUS
  Filled 2022-07-08: qty 100

## 2022-07-08 MED ORDER — PHENYLEPHRINE HCL-NACL 20-0.9 MG/250ML-% IV SOLN
INTRAVENOUS | Status: DC | PRN
  Administered 2022-07-08: 20 ug/min via INTRAVENOUS

## 2022-07-08 MED ORDER — ONDANSETRON HCL 4 MG/2ML IJ SOLN: 4.0000 mg | Freq: Four times a day (QID) | INTRAMUSCULAR | Status: DC | PRN

## 2022-07-08 MED ORDER — LIDOCAINE 2% (20 MG/ML) 5 ML SYRINGE
INTRAMUSCULAR | Status: DC | PRN
  Administered 2022-07-08: 60 mg via INTRAVENOUS

## 2022-07-08 MED ORDER — IOHEXOL 300 MG/ML  SOLN
150.0000 mL | Freq: Once | INTRAMUSCULAR | Status: AC | PRN
  Administered 2022-07-08: 70 mL via INTRAVENOUS

## 2022-07-08 MED ORDER — PROPOFOL 10 MG/ML IV BOLUS
INTRAVENOUS | Status: DC | PRN
  Administered 2022-07-08: 150 mg via INTRAVENOUS

## 2022-07-08 MED ORDER — CHLORHEXIDINE GLUCONATE 0.12 % MT SOLN
15.0000 mL | Freq: Once | OROMUCOSAL | Status: AC
  Administered 2022-07-08: 15 mL via OROMUCOSAL
  Filled 2022-07-08: qty 15

## 2022-07-08 MED ORDER — INSULIN ASPART 100 UNIT/ML IJ SOLN: 0.0000 [IU] | INTRAMUSCULAR | Status: DC | PRN

## 2022-07-08 MED ORDER — SUGAMMADEX SODIUM 200 MG/2ML IV SOLN
INTRAVENOUS | Status: DC | PRN
  Administered 2022-07-08: 140.6 mg via INTRAVENOUS

## 2022-07-08 MED ORDER — ORAL CARE MOUTH RINSE: 15.0000 mL | Freq: Once | OROMUCOSAL | Status: AC

## 2022-07-08 MED ORDER — OXYCODONE HCL 5 MG PO TABS: 5.0000 mg | ORAL_TABLET | Freq: Once | ORAL | Status: DC | PRN

## 2022-07-08 MED ORDER — FENTANYL CITRATE (PF) 100 MCG/2ML IJ SOLN
INTRAMUSCULAR | Status: DC | PRN
  Administered 2022-07-08: 100 ug via INTRAVENOUS
  Administered 2022-07-08: 50 ug via INTRAVENOUS

## 2022-07-08 MED ORDER — ONDANSETRON HCL 4 MG/2ML IJ SOLN
INTRAMUSCULAR | Status: DC | PRN
  Administered 2022-07-08: 4 mg via INTRAVENOUS

## 2022-07-08 MED ORDER — FENTANYL CITRATE (PF) 100 MCG/2ML IJ SOLN: 25.0000 ug | INTRAMUSCULAR | Status: DC | PRN

## 2022-07-08 MED ORDER — MIDAZOLAM HCL 5 MG/5ML IJ SOLN
INTRAMUSCULAR | Status: DC | PRN
  Administered 2022-07-08: 2 mg via INTRAVENOUS

## 2022-07-08 MED ORDER — FENTANYL CITRATE (PF) 250 MCG/5ML IJ SOLN
INTRAMUSCULAR | Status: AC
  Filled 2022-07-08: qty 5

## 2022-07-08 MED ORDER — SODIUM CHLORIDE 0.9 % IV SOLN: INTRAVENOUS | Status: DC

## 2022-07-08 MED ORDER — OXYCODONE HCL 5 MG/5ML PO SOLN: 5.0000 mg | Freq: Once | ORAL | Status: DC | PRN

## 2022-07-08 NOTE — Anesthesia Preprocedure Evaluation (Signed)
Anesthesia Evaluation  Patient identified by MRN, date of birth, ID band Patient awake    Reviewed: Allergy & Precautions, H&P , NPO status , Patient's Chart, lab work & pertinent test results  Airway Mallampati: II   Neck ROM: full    Dental   Pulmonary asthma , Current Smoker   breath sounds clear to auscultation       Cardiovascular negative cardio ROS  Rhythm:regular Rate:Normal     Neuro/Psych  Headaches PSYCHIATRIC DISORDERS Anxiety Depression       GI/Hepatic   Endo/Other  diabetes, Type 2    Renal/GU      Musculoskeletal   Abdominal   Peds  Hematology   Anesthesia Other Findings   Reproductive/Obstetrics                             Anesthesia Physical Anesthesia Plan  ASA: 2  Anesthesia Plan: General   Post-op Pain Management:    Induction: Intravenous  PONV Risk Score and Plan: 2 and Ondansetron, Dexamethasone, Midazolam and Treatment may vary due to age or medical condition  Airway Management Planned: Oral ETT  Additional Equipment:   Intra-op Plan:   Post-operative Plan: Extubation in OR  Informed Consent: I have reviewed the patients History and Physical, chart, labs and discussed the procedure including the risks, benefits and alternatives for the proposed anesthesia with the patient or authorized representative who has indicated his/her understanding and acceptance.     Dental advisory given  Plan Discussed with: CRNA, Anesthesiologist and Surgeon  Anesthesia Plan Comments:        Anesthesia Quick Evaluation

## 2022-07-08 NOTE — Brief Op Note (Signed)
  NEUROSURGERY BRIEF OPERATIVE  NOTE   PREOP DX: Pseudotumor Cerebri  POSTOP DX: Same  PROCEDURE: Diagnostic cerebral angiogram with dural venous sinus manometry  SURGEON: Dr. Lisbeth Renshaw, MD  ANESTHESIA: GETA  APPROACH:  Right trans-femoral venous Left trans-femoral arterial   EBL: Minimal  SPECIMENS: None  COMPLICATIONS: None  CONDITION: Stable to recovery  FINDINGS (Full report in [facility]): 1. Successful venous sinus manometry with minimal ( ) gradient across right TS junction and minimal ( ) gradient across left TS junction   Lisbeth Renshaw, MD [facility]

## 2022-07-08 NOTE — H&P (Signed)
Chief Complaint   Headache  History of Present Illness  [name] is a 32 year old woman I am seeing in consultation at the request of Dr. Sherryll Burger.  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  Briefly, the patient has a relatively long history of chronic headaches as well as other pain complaints including neck pain and back pain.  She has been treated for migraine headaches.  She has previously undergone lumbar puncture which, per review of provided medical records, revealed an opening pressure of 25 cm of water, reduced to 11 cm of water after removal of CSF.  In addition, patient has been treated with Diamox but is currently not taking it due to feeling of lightheadedness.  Patient did undergo ophthalmologic exam a few months ago which apparently did reveal papilledema.  More recent exam last week by report showed stability of the papilledema, without any worsening.  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  Of note, the patient reports a history of hypertension, dyslipidemia and diabetes.  She also reports a history of fatty liver.  No cancer history.  She is not on any blood thinners or antiplatelet agents.  She is a current smoker.  Past Medical History   Past Medical History:  Diagnosis Date   ADHD (attention deficit hyperactivity disorder)    Anemia    Anxiety    Asthma    as a child   Bronchitis 04/2020   recurrent (mostly every year)   Depression    Diabetes mellitus, type 2 (HCC) 12/2021   Fatty liver    Fibroid, uterine    Gestational diabetes    Headache    Liver mass    OCD (obsessive compulsive disorder)    Pneumonia 2021   Pseudotumor cerebri    PTSD (post-traumatic stress disorder)     Past Surgical History   Past Surgical History:  Procedure Laterality Date   CESAREAN SECTION  2013, 2015   CYSTOSCOPY N/A  06/24/2020   Procedure: CYSTOSCOPY;  Surgeon: Natale Milch, MD;  Location: [facility] ORS;  Service: Gynecology;  Laterality: N/A;   ROBOTIC ASSISTED LAPAROSCOPIC HYSTERECTOMY AND SALPINGECTOMY Bilateral 06/24/2020   Procedure: XI ROBOTIC ASSISTED LAPAROSCOPIC HYSTERECTOMY AND BILATERAL SALPINGECTOMY;  Surgeon: Natale Milch, MD;  Location: [facility] ORS;  Service: Gynecology;  Laterality: Bilateral;   TUBAL LIGATION  2015    Social History   Social History   Tobacco Use   Smoking status: Every Day    Packs/day: .5    Types: Cigarettes   Smokeless tobacco: Never  Vaping Use   Vaping Use: Never used  Substance Use Topics   Alcohol use: Not Currently   Drug use: Not Currently    Medications   Prior to Admission medications   Medication Sig Start Date End Date Taking? Authorizing Provider  albuterol (VENTOLIN HFA) 108 (90 Base) MCG/ACT inhaler Inhale 1-2 puffs into the lungs every 6 (six) hours as needed for wheezing or shortness of breath.   Yes [provider]  atorvastatin (LIPITOR) 10 MG tablet Take 10 mg by mouth daily.   Yes [provider]  Cholecalciferol (VITAMIN D3 PO) Take 1 tablet by mouth daily.   Yes [provider]  diazepam (VALIUM) 2 MG tablet Take 2 mg by mouth every 12 (twelve) hours as needed for anxiety.   Yes [provider]  Galcanezumab-gnlm 120 MG/ML SOAJ Inject 120 mg into the  skin every 28 (twenty-eight) days. 06/11/22  Yes [provider]  metFORMIN (GLUCOPHAGE) 500 MG tablet Take 500 mg by mouth in the morning and at bedtime. 01/18/22  Yes [provider]  OVER THE COUNTER MEDICATION Take 1 capsule by mouth daily. Aztec Roots - Weight Loss Supplement   Yes [provider]  pantoprazole (PROTONIX) 40 MG tablet Take 40 mg by mouth 2 (two) times daily. 08/01/19  Yes [provider]  cyclobenzaprine (FLEXERIL) 5 MG tablet Take 1 tablet (5 mg total) by mouth 3 (three) times daily as  needed for muscle spasms. Patient not taking: Reported on 06/24/2022 04/08/22   Minna Antis, MD  SUMAtriptan (IMITREX) 50 MG tablet Take 1 tablet (50 mg total) by mouth every 2 (two) hours as needed for migraine (Take one dose and if headache is not improved in 2 hours may take another dose, not to exceed 2 doses per day). May repeat in 2 hours if headache persists or recurs. Patient not taking: Reported on 06/24/2022 02/06/21   Mecum, Erin E, PA-C  Vitamin D, Ergocalciferol, (DRISDOL) 1.25 MG (50000 UNIT) CAPS capsule Take 50,000 Units by mouth every 7 (seven) days. Patient not taking: Reported on 06/24/2022    [provider]    Allergies   Allergies  Allergen Reactions   Prazosin Palpitations   Bupropion     Very Lethargic   Other     Hydroxazine - excessive sleepiness (slept 20 hours)   Vyvanse [Lisdexamfetamine] Other (See Comments)    "Extreme OCD behavior"    Review of Systems  ROS  Neurologic Exam  Awake, alert, oriented Memory and concentration grossly intact Speech fluent, appropriate CN grossly intact Motor exam: Upper Extremities Deltoid Bicep Tricep Grip  Right 5/5 5/5 5/5 5/5  Left 5/5 5/5 5/5 5/5   Lower Extremities IP Quad PF DF EHL  Right 5/5 5/5 5/5 5/5 5/5  Left 5/5 5/5 5/5 5/5 5/5   Sensation grossly intact to LT  Imaging  MRV reveals 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 We have also reviewed in detail the risks, benefits, and alternatives to the procedure. All questions were answered and Khaleigh Gottfried provided informed consent to proceed.  Joyce Renshaw,  MD [facility]

## 2022-07-08 NOTE — Transfer of Care (Signed)
Immediate Anesthesia Transfer of Care Note  Patient: Joyce Swanson  Procedure(s) Performed: Angiogram with bilateral venous sinus manometry  Patient Location: PACU  Anesthesia Type:General  Level of Consciousness: drowsy  Airway & Oxygen Therapy: Patient Spontanous Breathing and Patient connected to face mask oxygen  Post-op Assessment: Report given to RN and Post -op Vital signs reviewed and stable  Post vital signs: Reviewed and stable  Last Vitals:  Vitals Value Taken Time  BP 129/85 07/08/22 1630  Temp 36.6 C 07/08/22 1620  Pulse 87 07/08/22 1634  Resp 19 07/08/22 1634  SpO2 100 % 07/08/22 1634  Vitals shown include unvalidated device data.  Last Pain:  Vitals:   07/08/22 1231  TempSrc:   PainSc: 0-No pain         Complications: No notable events documented.

## 2022-07-08 NOTE — Anesthesia Procedure Notes (Signed)
Procedure Name: Intubation Date/Time: 07/08/2022 2:25 PM  Performed by: Montez Morita, Daxton Nydam W, CRNAPre-anesthesia Checklist: Patient identified, Emergency Drugs available, Suction available and Patient being monitored Patient Re-evaluated:Patient Re-evaluated prior to induction Oxygen Delivery Method: Circle system utilized Preoxygenation: Pre-oxygenation with 100% oxygen Induction Type: IV induction Ventilation: Mask ventilation without difficulty Laryngoscope Size: Miller and 2 Grade View: Grade I Tube type: Oral Tube size: 7.0 mm Number of attempts: 2 Airway Equipment and Method: Stylet and Oral airway Placement Confirmation: ETT inserted through vocal cords under direct vision, positive ETCO2 and breath sounds checked- equal and bilateral Secured at: 22 cm Tube secured with: Tape Dental Injury: Teeth and Oropharynx as per pre-operative assessment

## 2022-07-09 ENCOUNTER — Encounter (HOSPITAL_COMMUNITY): Payer: Self-pay | Admitting: Neurosurgery

## 2022-07-09 NOTE — Anesthesia Postprocedure Evaluation (Signed)
Anesthesia Post Note  Patient: Joyce Swanson  Procedure(s) Performed: Angiogram with bilateral venous sinus manometry     Patient location during evaluation: PACU Anesthesia Type: General Level of consciousness: awake and alert Pain management: pain level controlled Vital Signs Assessment: post-procedure vital signs reviewed and stable Respiratory status: spontaneous breathing, nonlabored ventilation and respiratory function stable Cardiovascular status: blood pressure returned to baseline Postop Assessment: no apparent nausea or vomiting Anesthetic complications: no   No notable events documented.          Shanda Howells

## 2022-07-13 ENCOUNTER — Encounter: Payer: Medicaid Other | Attending: Student | Admitting: Dietician

## 2022-07-13 ENCOUNTER — Encounter: Payer: Self-pay | Admitting: Dietician

## 2022-07-13 DIAGNOSIS — E119 Type 2 diabetes mellitus without complications: Secondary | ICD-10-CM | POA: Diagnosis not present

## 2022-07-13 DIAGNOSIS — F4312 Post-traumatic stress disorder, chronic: Secondary | ICD-10-CM | POA: Diagnosis not present

## 2022-07-13 NOTE — Progress Notes (Signed)
Diabetes Self-Management Education  Visit Type: Follow-up  Appt. Start Time: 1400 Appt. End Time: 1421  07/13/2022  [name], identified by name and date of birth, is a 32 y.o. female with a diagnosis of Diabetes: Type 2.   ASSESSMENT  Primary concern: Pt states she is concerned about how she can best control her blood sugar.    History includes: anemia, anxiety, asthma, depression, type 2 diabetes, sleep apnea.  Labs noted: reviewed, A1c 6.2% 07/02/22 Medications include: metformin Supplements: aztec root, vitamin D  Pt states she recently weighed herself at home and she was 154 lbs (last visit in person 2 months ago pt was 165 lbs).  Pt A1c came down from 7.8% (pt report) to 6.2%.   Pt states she has been trying to be more consistent and conscious about her food choices and trying to include complex carbs such as whole wheat breads, potatoes and sweet potatoes, a protein and 1/2 plate of vegetables at meals.   Pt has not been getting low blood glucose any more.   Pt states she has usually been eating 3 meals per day and sometimes a snack. She reports she is snacking less, and when she craves something sweet she will just have a mini candy.   Pt reports she has been trying to quit smoking. She states she has a patch on now. She wants to continue to work on quitting for her health.   Last menstrual period 05/27/2020. There is no height or weight on file to calculate BMI.   Diabetes Self-Management Education - 07/13/22 1400       Visit Information   Visit Type Follow-up      Initial Visit   Diabetes Type Type 2    Are you currently following a meal plan? No    Are you taking your medications as prescribed? Yes      Health Coping   How would you rate your overall health? Good      Psychosocial Assessment   Patient Belief/Attitude about Diabetes Motivated to manage diabetes    What is the hardest part about your diabetes right now, causing you the most concern,  or is the most worrisome to you about your diabetes?   Making healty food and beverage choices    Self-care barriers None    Self-management support Doctor's office    Other persons present Patient    Patient Concerns Nutrition/Meal planning    Special Needs None    Preferred Learning Style No preference indicated    Learning Readiness Ready      Pre-Education Assessment   Patient understands the diabetes disease and treatment process. Needs Review    Patient understands incorporating nutritional management into lifestyle. Needs Review    Patient undertands incorporating physical activity into lifestyle. Needs Review    Patient understands using medications safely. Needs Review    Patient understands monitoring blood glucose, interpreting and using results Needs Review    Patient understands prevention, detection, and treatment of acute complications. Needs Review    Patient understands prevention, detection, and treatment of chronic complications. Needs Review    Patient understands how to develop strategies to address psychosocial issues. Needs Review    Patient understands how to develop strategies to promote health/change behavior. Needs Review      Complications   Last HgB A1C per patient/outside source 6.2 %    How often do you check your blood sugar? 1-2 times/day    Fasting Blood glucose range (mg/dL)  70-129    Number of hypoglycemic episodes per month 0      Dietary Intake   Breakfast breakfast tacos (egg and sausage on whole wheat tortillas)    Snack (morning) none    Lunch sandwich on whole wheat bread with side salad    Snack (afternoon) hummus and triscuits    Dinner protein, starch, vegetables    Snack (evening) mini candy OR honey roasted peanut butter    Beverage(s) water, 2% milk, half sweet tea      Activity / Exercise   Activity / Exercise Type ADL's    How many days per week do you exercise? 0    How many minutes per day do you exercise? 0    Total minutes  per week of exercise 0      Patient Education   Previous Diabetes Education No    Disease Pathophysiology Explored patient's options for treatment of their diabetes    Healthy Eating Role of diet in the treatment of diabetes and the relationship between the three main macronutrients and blood glucose level;Plate Method;Reviewed blood glucose goals for pre and post meals and how to evaluate the patients' food intake on their blood glucose level.;Meal timing in regards to the patients' current diabetes medication.;Meal options for control of blood glucose level and chronic complications.;Information on hints to eating out and maintain blood glucose control.    Being Active Helped patient identify appropriate exercises in relation to his/her diabetes, diabetes complications and other health issue.;Role of exercise on diabetes management, blood pressure control and cardiac health.    Monitoring Identified appropriate SMBG and/or A1C goals.    Acute complications Discussed and identified patients' prevention, symptoms, and treatment of hyperglycemia.    Chronic complications Relationship between chronic complications and blood glucose control;Identified and discussed with patient  current chronic complications    Diabetes Stress and Support Identified and addressed patients feelings and concerns about diabetes;Role of stress on diabetes;Worked with patient to identify barriers to care and solutions    Lifestyle and Health Coping Lifestyle issues that need to be addressed for better diabetes care      Individualized Goals (developed by patient)   Nutrition General guidelines for healthy choices and portions discussed    Physical Activity Exercise 3-5 times per week;30 minutes per day    Medications take my medication as prescribed    Monitoring  Test my blood glucose as discussed    Problem Solving Eating Pattern    Reducing Risk examine blood glucose patterns;stop smoking;do foot checks daily;treat  hypoglycemia with 15 grams of carbs if blood glucose less than 70mg /dL    Health Coping Ask for help with psychological, social, or emotional issues      Patient Self-Evaluation of Goals - Patient rates self as meeting previously set goals (% of time)   Nutrition >75% (most of the time)    Physical Activity 25 - 50% (sometimes)    Medications >75% (most of the time)    Monitoring 50 - 75 % (half of the time)    Problem Solving and behavior change strategies  50 - 75 % (half of the time)    Reducing Risk (treating acute and chronic complications) 50 - 75 % (half of the time)    Health Coping 50 - 75 % (half of the time)      Post-Education Assessment   Patient understands the diabetes disease and treatment process. Comprehends key points    Patient understands incorporating nutritional management into  lifestyle. Demonstrates understanding / competency    Patient undertands incorporating physical activity into lifestyle. Comprehends key points    Patient understands using medications safely. Demonstrates understanding / competency    Patient understands monitoring blood glucose, interpreting and using results Demonstrates understanding / competency    Patient understands prevention, detection, and treatment of acute complications. Comprehends key points    Patient understands prevention, detection, and treatment of chronic complications. Comprehends key points    Patient understands how to develop strategies to address psychosocial issues. Comprehends key points    Patient understands how to develop strategies to promote health/change behavior. Comprehends key points      Outcomes   Expected Outcomes Demonstrated interest in learning. Expect positive outcomes    Future DMSE PRN    Program Status Completed      Subsequent Visit   Since your last visit have you continued or begun to take your medications as prescribed? Yes    Since your last visit, are you checking your blood glucose at  least once a day? Yes             Individualized Plan for Diabetes Self-Management Training:   Learning Objective:  Patient will have a greater understanding of diabetes self-management. Patient education plan is to attend individual and/or group sessions per assessed needs and concerns.   Plan:   Patient Instructions  Previous Goals: In progress, continue.   Goal: Aim to make your plate look like the Diabetes Plate 2x/day (1/2 plate vegetables, 1/4 protein, 1/4 carb).    Goal: Put together some to-go snacks that include a carb and protein.    Rethink what you drink. Choose beverages without added sugar. Look for 0 carbs on the label.  Aim to eat within 1-2 hours of waking up and every 3-5 hours following.   Expected Outcomes:  Demonstrated interest in learning. Expect positive outcomes  Education material provided: No handouts provided on this follow up.  If problems or questions, patient to contact team via:  Phone  Future DSME appointment: PRN

## 2022-07-13 NOTE — Patient Instructions (Signed)
Previous Goals: In progress, continue.   Goal: Aim to make your plate look like the Diabetes Plate 2x/day (1/2 plate vegetables, 1/4 protein, 1/4 carb).    Goal: Put together some to-go snacks that include a carb and protein.    Rethink what you drink. Choose beverages without added sugar. Look for 0 carbs on the label.  Aim to eat within 1-2 hours of waking up and every 3-5 hours following.

## 2022-07-15 DIAGNOSIS — F4312 Post-traumatic stress disorder, chronic: Secondary | ICD-10-CM | POA: Diagnosis not present

## 2022-07-19 DIAGNOSIS — G932 Benign intracranial hypertension: Secondary | ICD-10-CM | POA: Diagnosis not present

## 2022-07-20 DIAGNOSIS — I871 Compression of vein: Secondary | ICD-10-CM | POA: Diagnosis not present

## 2022-07-20 DIAGNOSIS — G932 Benign intracranial hypertension: Secondary | ICD-10-CM | POA: Diagnosis not present

## 2022-07-22 DIAGNOSIS — F4312 Post-traumatic stress disorder, chronic: Secondary | ICD-10-CM | POA: Diagnosis not present

## 2022-07-24 DIAGNOSIS — F4312 Post-traumatic stress disorder, chronic: Secondary | ICD-10-CM | POA: Diagnosis not present

## 2022-07-27 DIAGNOSIS — F4312 Post-traumatic stress disorder, chronic: Secondary | ICD-10-CM | POA: Diagnosis not present

## 2022-07-29 DIAGNOSIS — F4312 Post-traumatic stress disorder, chronic: Secondary | ICD-10-CM | POA: Diagnosis not present

## 2022-08-02 DIAGNOSIS — M7918 Myalgia, other site: Secondary | ICD-10-CM | POA: Diagnosis not present

## 2022-08-02 DIAGNOSIS — G43809 Other migraine, not intractable, without status migrainosus: Secondary | ICD-10-CM | POA: Diagnosis not present

## 2022-08-02 DIAGNOSIS — M542 Cervicalgia: Secondary | ICD-10-CM | POA: Diagnosis not present

## 2022-08-02 DIAGNOSIS — R200 Anesthesia of skin: Secondary | ICD-10-CM | POA: Diagnosis not present

## 2022-08-02 DIAGNOSIS — R202 Paresthesia of skin: Secondary | ICD-10-CM | POA: Diagnosis not present

## 2022-08-03 DIAGNOSIS — F4312 Post-traumatic stress disorder, chronic: Secondary | ICD-10-CM | POA: Diagnosis not present

## 2022-08-05 DIAGNOSIS — R202 Paresthesia of skin: Secondary | ICD-10-CM | POA: Diagnosis not present

## 2022-08-05 DIAGNOSIS — R200 Anesthesia of skin: Secondary | ICD-10-CM | POA: Diagnosis not present

## 2022-08-10 DIAGNOSIS — F4312 Post-traumatic stress disorder, chronic: Secondary | ICD-10-CM | POA: Diagnosis not present

## 2022-08-12 DIAGNOSIS — F4312 Post-traumatic stress disorder, chronic: Secondary | ICD-10-CM | POA: Diagnosis not present

## 2022-08-17 DIAGNOSIS — F4312 Post-traumatic stress disorder, chronic: Secondary | ICD-10-CM | POA: Diagnosis not present

## 2022-08-19 DIAGNOSIS — F4312 Post-traumatic stress disorder, chronic: Secondary | ICD-10-CM | POA: Diagnosis not present

## 2022-08-24 DIAGNOSIS — F4312 Post-traumatic stress disorder, chronic: Secondary | ICD-10-CM | POA: Diagnosis not present

## 2022-08-26 DIAGNOSIS — F4312 Post-traumatic stress disorder, chronic: Secondary | ICD-10-CM | POA: Diagnosis not present

## 2022-08-30 ENCOUNTER — Encounter: Payer: Self-pay | Admitting: Neurology

## 2022-09-01 DIAGNOSIS — E119 Type 2 diabetes mellitus without complications: Secondary | ICD-10-CM | POA: Diagnosis not present

## 2022-09-07 DIAGNOSIS — F4312 Post-traumatic stress disorder, chronic: Secondary | ICD-10-CM | POA: Diagnosis not present

## 2022-09-08 ENCOUNTER — Ambulatory Visit
Admission: RE | Admit: 2022-09-08 | Discharge: 2022-09-08 | Disposition: A | Discharge status: Home / Self Care | Payer: Medicaid Other | Source: Ambulatory Visit | Attending: Student | Admitting: Student

## 2022-09-08 DIAGNOSIS — M542 Cervicalgia: Secondary | ICD-10-CM | POA: Diagnosis not present

## 2022-09-08 DIAGNOSIS — M7918 Myalgia, other site: Secondary | ICD-10-CM

## 2022-09-08 DIAGNOSIS — G932 Benign intracranial hypertension: Secondary | ICD-10-CM

## 2022-09-10 DIAGNOSIS — F4312 Post-traumatic stress disorder, chronic: Secondary | ICD-10-CM | POA: Diagnosis not present

## 2022-09-14 DIAGNOSIS — F4312 Post-traumatic stress disorder, chronic: Secondary | ICD-10-CM | POA: Diagnosis not present

## 2022-09-21 DIAGNOSIS — N393 Stress incontinence (female) (male): Secondary | ICD-10-CM | POA: Diagnosis not present

## 2022-09-21 DIAGNOSIS — N644 Mastodynia: Secondary | ICD-10-CM | POA: Diagnosis not present

## 2022-09-23 DIAGNOSIS — F4312 Post-traumatic stress disorder, chronic: Secondary | ICD-10-CM | POA: Diagnosis not present

## 2022-09-28 DIAGNOSIS — F4312 Post-traumatic stress disorder, chronic: Secondary | ICD-10-CM | POA: Diagnosis not present

## 2022-09-30 DIAGNOSIS — F4312 Post-traumatic stress disorder, chronic: Secondary | ICD-10-CM | POA: Diagnosis not present

## 2022-10-01 DIAGNOSIS — F900 Attention-deficit hyperactivity disorder, predominantly inattentive type: Secondary | ICD-10-CM | POA: Diagnosis not present

## 2022-10-01 DIAGNOSIS — F411 Generalized anxiety disorder: Secondary | ICD-10-CM | POA: Diagnosis not present

## 2022-10-01 DIAGNOSIS — F332 Major depressive disorder, recurrent severe without psychotic features: Secondary | ICD-10-CM | POA: Diagnosis not present

## 2022-10-01 DIAGNOSIS — F410 Panic disorder [episodic paroxysmal anxiety] without agoraphobia: Secondary | ICD-10-CM | POA: Diagnosis not present

## 2022-10-01 DIAGNOSIS — F4312 Post-traumatic stress disorder, chronic: Secondary | ICD-10-CM | POA: Diagnosis not present

## 2022-10-05 DIAGNOSIS — F4312 Post-traumatic stress disorder, chronic: Secondary | ICD-10-CM | POA: Diagnosis not present

## 2022-10-07 DIAGNOSIS — F4312 Post-traumatic stress disorder, chronic: Secondary | ICD-10-CM | POA: Diagnosis not present

## 2022-10-14 DIAGNOSIS — F4312 Post-traumatic stress disorder, chronic: Secondary | ICD-10-CM | POA: Diagnosis not present

## 2022-10-26 DIAGNOSIS — F4312 Post-traumatic stress disorder, chronic: Secondary | ICD-10-CM | POA: Diagnosis not present

## 2022-10-27 DIAGNOSIS — F4312 Post-traumatic stress disorder, chronic: Secondary | ICD-10-CM | POA: Diagnosis not present

## 2022-10-28 DIAGNOSIS — D225 Melanocytic nevi of trunk: Secondary | ICD-10-CM | POA: Diagnosis not present

## 2022-10-28 DIAGNOSIS — D2271 Melanocytic nevi of right lower limb, including hip: Secondary | ICD-10-CM | POA: Diagnosis not present

## 2022-10-28 DIAGNOSIS — D2262 Melanocytic nevi of left upper limb, including shoulder: Secondary | ICD-10-CM | POA: Diagnosis not present

## 2022-10-28 DIAGNOSIS — D485 Neoplasm of uncertain behavior of skin: Secondary | ICD-10-CM | POA: Diagnosis not present

## 2022-10-28 DIAGNOSIS — L659 Nonscarring hair loss, unspecified: Secondary | ICD-10-CM | POA: Diagnosis not present

## 2022-10-28 DIAGNOSIS — D2261 Melanocytic nevi of right upper limb, including shoulder: Secondary | ICD-10-CM | POA: Diagnosis not present

## 2022-10-28 DIAGNOSIS — D2272 Melanocytic nevi of left lower limb, including hip: Secondary | ICD-10-CM | POA: Diagnosis not present

## 2022-11-02 DIAGNOSIS — F4312 Post-traumatic stress disorder, chronic: Secondary | ICD-10-CM | POA: Diagnosis not present

## 2022-11-04 DIAGNOSIS — F4312 Post-traumatic stress disorder, chronic: Secondary | ICD-10-CM | POA: Diagnosis not present

## 2022-11-11 DIAGNOSIS — F4312 Post-traumatic stress disorder, chronic: Secondary | ICD-10-CM | POA: Diagnosis not present

## 2022-11-16 DIAGNOSIS — F4312 Post-traumatic stress disorder, chronic: Secondary | ICD-10-CM | POA: Diagnosis not present

## 2022-11-17 DIAGNOSIS — E119 Type 2 diabetes mellitus without complications: Secondary | ICD-10-CM | POA: Diagnosis not present

## 2022-11-17 DIAGNOSIS — Z0000 Encounter for general adult medical examination without abnormal findings: Secondary | ICD-10-CM | POA: Diagnosis not present

## 2022-11-18 ENCOUNTER — Other Ambulatory Visit: Payer: Self-pay | Admitting: Nurse Practitioner

## 2022-11-18 DIAGNOSIS — N644 Mastodynia: Secondary | ICD-10-CM

## 2022-11-18 DIAGNOSIS — F4312 Post-traumatic stress disorder, chronic: Secondary | ICD-10-CM | POA: Diagnosis not present

## 2022-11-23 DIAGNOSIS — F4312 Post-traumatic stress disorder, chronic: Secondary | ICD-10-CM | POA: Diagnosis not present

## 2022-11-25 DIAGNOSIS — F4312 Post-traumatic stress disorder, chronic: Secondary | ICD-10-CM | POA: Diagnosis not present

## 2022-11-30 DIAGNOSIS — F4312 Post-traumatic stress disorder, chronic: Secondary | ICD-10-CM | POA: Diagnosis not present

## 2022-12-02 DIAGNOSIS — F4312 Post-traumatic stress disorder, chronic: Secondary | ICD-10-CM | POA: Diagnosis not present

## 2022-12-07 DIAGNOSIS — F4312 Post-traumatic stress disorder, chronic: Secondary | ICD-10-CM | POA: Diagnosis not present

## 2022-12-09 DIAGNOSIS — F4312 Post-traumatic stress disorder, chronic: Secondary | ICD-10-CM | POA: Diagnosis not present

## 2022-12-13 DIAGNOSIS — J301 Allergic rhinitis due to pollen: Secondary | ICD-10-CM | POA: Diagnosis not present

## 2022-12-13 DIAGNOSIS — F17200 Nicotine dependence, unspecified, uncomplicated: Secondary | ICD-10-CM | POA: Diagnosis not present

## 2022-12-16 DIAGNOSIS — F4312 Post-traumatic stress disorder, chronic: Secondary | ICD-10-CM | POA: Diagnosis not present

## 2022-12-17 DIAGNOSIS — M7662 Achilles tendinitis, left leg: Secondary | ICD-10-CM | POA: Diagnosis not present

## 2022-12-17 DIAGNOSIS — M7732 Calcaneal spur, left foot: Secondary | ICD-10-CM | POA: Diagnosis not present

## 2022-12-17 DIAGNOSIS — M76822 Posterior tibial tendinitis, left leg: Secondary | ICD-10-CM | POA: Diagnosis not present

## 2022-12-17 DIAGNOSIS — M79672 Pain in left foot: Secondary | ICD-10-CM | POA: Diagnosis not present

## 2022-12-17 DIAGNOSIS — E119 Type 2 diabetes mellitus without complications: Secondary | ICD-10-CM | POA: Diagnosis not present

## 2022-12-20 ENCOUNTER — Other Ambulatory Visit: Payer: Medicaid Other

## 2022-12-21 DIAGNOSIS — F4312 Post-traumatic stress disorder, chronic: Secondary | ICD-10-CM | POA: Diagnosis not present

## 2022-12-23 DIAGNOSIS — F4312 Post-traumatic stress disorder, chronic: Secondary | ICD-10-CM | POA: Diagnosis not present

## 2022-12-28 DIAGNOSIS — F4312 Post-traumatic stress disorder, chronic: Secondary | ICD-10-CM | POA: Diagnosis not present

## 2022-12-30 DIAGNOSIS — F4312 Post-traumatic stress disorder, chronic: Secondary | ICD-10-CM | POA: Diagnosis not present

## 2023-01-04 ENCOUNTER — Other Ambulatory Visit: Payer: Medicaid Other

## 2023-01-06 DIAGNOSIS — F4312 Post-traumatic stress disorder, chronic: Secondary | ICD-10-CM | POA: Diagnosis not present

## 2023-01-09 ENCOUNTER — Other Ambulatory Visit
Admission: RE | Admit: 2023-01-09 | Discharge: 2023-01-09 | Disposition: A | Discharge status: Home / Self Care | Payer: Medicaid Other | Source: Ambulatory Visit | Attending: Family Medicine | Admitting: Family Medicine

## 2023-01-09 DIAGNOSIS — E119 Type 2 diabetes mellitus without complications: Secondary | ICD-10-CM | POA: Diagnosis not present

## 2023-01-09 DIAGNOSIS — G43111 Migraine with aura, intractable, with status migrainosus: Secondary | ICD-10-CM | POA: Diagnosis not present

## 2023-01-09 DIAGNOSIS — N390 Urinary tract infection, site not specified: Secondary | ICD-10-CM | POA: Diagnosis not present

## 2023-01-09 DIAGNOSIS — M2550 Pain in unspecified joint: Secondary | ICD-10-CM | POA: Diagnosis not present

## 2023-01-09 DIAGNOSIS — R5383 Other fatigue: Secondary | ICD-10-CM | POA: Diagnosis not present

## 2023-01-09 DIAGNOSIS — F419 Anxiety disorder, unspecified: Secondary | ICD-10-CM | POA: Diagnosis not present

## 2023-01-09 DIAGNOSIS — R1013 Epigastric pain: Secondary | ICD-10-CM | POA: Diagnosis not present

## 2023-01-09 LAB — COMPREHENSIVE METABOLIC PANEL
ALT: 101 U/L — ABNORMAL HIGH (ref 0–44)
AST: 63 U/L — ABNORMAL HIGH (ref 15–41)
Albumin: 4.8 g/dL (ref 3.5–5.0)
Alkaline Phosphatase: 97 U/L (ref 38–126)
Anion gap: 9 (ref 5–15)
BUN: 12 mg/dL (ref 6–20)
CO2: 27 mmol/L (ref 22–32)
Calcium: 9.4 mg/dL (ref 8.9–10.3)
Chloride: 100 mmol/L (ref 98–111)
Creatinine, Ser: 0.61 mg/dL (ref 0.44–1.00)
GFR, Estimated: >60 mL/min (ref >60)
Glucose, Bld: 234 mg/dL — ABNORMAL HIGH (ref 70–99)
Potassium: 3.8 mmol/L (ref 3.5–5.1)
Sodium: 136 mmol/L (ref 135–145)
Total Bilirubin: 0.5 mg/dL (ref <1.2)
Total Protein: 8.6 g/dL — ABNORMAL HIGH (ref 6.5–8.1)

## 2023-01-09 LAB — CK: Total CK: 98 U/L (ref 38–234)

## 2023-01-11 ENCOUNTER — Other Ambulatory Visit: Payer: Medicaid Other

## 2023-01-11 ENCOUNTER — Ambulatory Visit: Payer: Medicaid Other

## 2023-01-11 ENCOUNTER — Ambulatory Visit
Admission: RE | Admit: 2023-01-11 | Discharge: 2023-01-11 | Disposition: A | Discharge status: Home / Self Care | Payer: Medicaid Other | Source: Ambulatory Visit | Attending: Nurse Practitioner | Admitting: Nurse Practitioner

## 2023-01-11 DIAGNOSIS — F4312 Post-traumatic stress disorder, chronic: Secondary | ICD-10-CM | POA: Diagnosis not present

## 2023-01-11 DIAGNOSIS — N644 Mastodynia: Secondary | ICD-10-CM | POA: Diagnosis not present

## 2023-01-13 DIAGNOSIS — F4312 Post-traumatic stress disorder, chronic: Secondary | ICD-10-CM | POA: Diagnosis not present

## 2023-01-18 DIAGNOSIS — J301 Allergic rhinitis due to pollen: Secondary | ICD-10-CM | POA: Diagnosis not present

## 2023-01-18 DIAGNOSIS — F4312 Post-traumatic stress disorder, chronic: Secondary | ICD-10-CM | POA: Diagnosis not present

## 2023-01-19 ENCOUNTER — Other Ambulatory Visit: Payer: Medicaid Other

## 2023-01-20 DIAGNOSIS — F4312 Post-traumatic stress disorder, chronic: Secondary | ICD-10-CM | POA: Diagnosis not present

## 2023-01-25 DIAGNOSIS — F4312 Post-traumatic stress disorder, chronic: Secondary | ICD-10-CM | POA: Diagnosis not present

## 2023-01-28 ENCOUNTER — Other Ambulatory Visit: Payer: Self-pay | Admitting: Medical Genetics

## 2023-01-31 ENCOUNTER — Other Ambulatory Visit
Admission: RE | Admit: 2023-01-31 | Discharge: 2023-01-31 | Disposition: A | Discharge status: Home / Self Care | Payer: Medicaid Other | Source: Ambulatory Visit | Attending: Medical Genetics | Admitting: Medical Genetics

## 2023-02-02 ENCOUNTER — Other Ambulatory Visit: Payer: Self-pay | Admitting: Medical Genetics

## 2023-02-02 ENCOUNTER — Other Ambulatory Visit: Payer: Medicaid Other

## 2023-02-03 DIAGNOSIS — F4312 Post-traumatic stress disorder, chronic: Secondary | ICD-10-CM | POA: Diagnosis not present

## 2023-02-04 DIAGNOSIS — E119 Type 2 diabetes mellitus without complications: Secondary | ICD-10-CM | POA: Diagnosis not present

## 2023-02-04 DIAGNOSIS — G8929 Other chronic pain: Secondary | ICD-10-CM | POA: Diagnosis not present

## 2023-02-04 DIAGNOSIS — M2550 Pain in unspecified joint: Secondary | ICD-10-CM | POA: Diagnosis not present

## 2023-02-07 ENCOUNTER — Other Ambulatory Visit: Payer: Medicaid Other

## 2023-02-08 DIAGNOSIS — F4312 Post-traumatic stress disorder, chronic: Secondary | ICD-10-CM | POA: Diagnosis not present

## 2023-02-09 DIAGNOSIS — D1720 Benign lipomatous neoplasm of skin and subcutaneous tissue of unspecified limb: Secondary | ICD-10-CM | POA: Diagnosis not present

## 2023-02-10 DIAGNOSIS — F4312 Post-traumatic stress disorder, chronic: Secondary | ICD-10-CM | POA: Diagnosis not present

## 2023-02-15 LAB — GENECONNECT MOLECULAR SCREEN: Genetic Analysis Overall Interpretation: NEGATIVE

## 2023-02-17 DIAGNOSIS — F4312 Post-traumatic stress disorder, chronic: Secondary | ICD-10-CM | POA: Diagnosis not present

## 2023-02-25 DIAGNOSIS — F4312 Post-traumatic stress disorder, chronic: Secondary | ICD-10-CM | POA: Diagnosis not present

## 2023-02-26 ENCOUNTER — Emergency Department: Payer: Medicaid Other

## 2023-02-26 ENCOUNTER — Other Ambulatory Visit: Payer: Self-pay

## 2023-02-26 DIAGNOSIS — Z5321 Procedure and treatment not carried out due to patient leaving prior to being seen by health care provider: Secondary | ICD-10-CM | POA: Diagnosis not present

## 2023-02-26 DIAGNOSIS — R410 Disorientation, unspecified: Secondary | ICD-10-CM | POA: Diagnosis not present

## 2023-02-26 DIAGNOSIS — R42 Dizziness and giddiness: Secondary | ICD-10-CM | POA: Insufficient documentation

## 2023-02-26 DIAGNOSIS — R519 Headache, unspecified: Secondary | ICD-10-CM | POA: Diagnosis not present

## 2023-02-26 LAB — CBC WITH DIFFERENTIAL/PLATELET
Abs Immature Granulocytes: 0.06 10*3/uL (ref 0.00–0.07)
Basophils Absolute: 0.1 10*3/uL (ref 0.0–0.1)
Basophils Relative: 1 %
Eosinophils Absolute: 0.9 10*3/uL — ABNORMAL HIGH (ref 0.0–0.5)
Eosinophils Relative: 8 %
HCT: 41.4 % (ref 36.0–46.0)
Hemoglobin: 13.9 g/dL (ref 12.0–15.0)
Immature Granulocytes: 1 %
Lymphocytes Relative: 39 %
Lymphs Abs: 4.4 10*3/uL — ABNORMAL HIGH (ref 0.7–4.0)
MCH: 30.3 pg (ref 26.0–34.0)
MCHC: 33.6 g/dL (ref 30.0–36.0)
MCV: 90.4 fL (ref 80.0–100.0)
Monocytes Absolute: 0.8 10*3/uL (ref 0.1–1.0)
Monocytes Relative: 7 %
Neutro Abs: 4.9 10*3/uL (ref 1.7–7.7)
Neutrophils Relative %: 44 %
Platelets: 346 10*3/uL (ref 150–400)
RBC: 4.58 MIL/uL (ref 3.87–5.11)
RDW: 13.4 % (ref 11.5–15.5)
Smear Review: NORMAL
WBC: 11.2 10*3/uL — ABNORMAL HIGH (ref 4.0–10.5)
nRBC: 0 % (ref 0.0–0.2)

## 2023-02-26 LAB — COMPREHENSIVE METABOLIC PANEL
ALT: 104 U/L — ABNORMAL HIGH (ref 0–44)
AST: 73 U/L — ABNORMAL HIGH (ref 15–41)
Albumin: 4.2 g/dL (ref 3.5–5.0)
Alkaline Phosphatase: 89 U/L (ref 38–126)
Anion gap: 10 (ref 5–15)
BUN: 14 mg/dL (ref 6–20)
CO2: 26 mmol/L (ref 22–32)
Calcium: 9.3 mg/dL (ref 8.9–10.3)
Chloride: 100 mmol/L (ref 98–111)
Creatinine, Ser: 0.65 mg/dL (ref 0.44–1.00)
GFR, Estimated: >60 mL/min (ref >60)
Glucose, Bld: 219 mg/dL — ABNORMAL HIGH (ref 70–99)
Potassium: 3.8 mmol/L (ref 3.5–5.1)
Sodium: 136 mmol/L (ref 135–145)
Total Bilirubin: 0.5 mg/dL (ref <1.2)
Total Protein: 7.9 g/dL (ref 6.5–8.1)

## 2023-02-26 LAB — CBG MONITORING, ED: Glucose-Capillary: 239 mg/dL — ABNORMAL HIGH (ref 70–99)

## 2023-02-26 NOTE — ED Triage Notes (Signed)
Pt has hx intracranial hypertension and constantly has headaches, pt states pain tonight is worse. Pt reports a lot of pressure in her head, per family member over the past few weeks pt has had some confusion and hard time finding her words. Pt reports numbness in her fingers that began 1 hour. Pt reports dizziness that is worse than normal.

## 2023-02-27 ENCOUNTER — Emergency Department
Admission: EM | Admit: 2023-02-27 | Discharge: 2023-02-27 | Discharge status: Against Medical Advice | Payer: Medicaid Other | Attending: Emergency Medicine | Admitting: Emergency Medicine

## 2023-02-28 DIAGNOSIS — D1722 Benign lipomatous neoplasm of skin and subcutaneous tissue of left arm: Secondary | ICD-10-CM | POA: Diagnosis not present

## 2023-02-28 DIAGNOSIS — D1720 Benign lipomatous neoplasm of skin and subcutaneous tissue of unspecified limb: Secondary | ICD-10-CM | POA: Diagnosis not present

## 2023-03-03 DIAGNOSIS — F4312 Post-traumatic stress disorder, chronic: Secondary | ICD-10-CM | POA: Diagnosis not present

## 2023-03-04 DIAGNOSIS — M5412 Radiculopathy, cervical region: Secondary | ICD-10-CM | POA: Diagnosis not present

## 2023-03-04 DIAGNOSIS — M5416 Radiculopathy, lumbar region: Secondary | ICD-10-CM | POA: Diagnosis not present

## 2023-03-04 DIAGNOSIS — R52 Pain, unspecified: Secondary | ICD-10-CM | POA: Diagnosis not present

## 2023-03-07 DIAGNOSIS — J301 Allergic rhinitis due to pollen: Secondary | ICD-10-CM | POA: Diagnosis not present

## 2023-03-09 ENCOUNTER — Other Ambulatory Visit: Payer: Self-pay | Admitting: Family Medicine

## 2023-03-09 ENCOUNTER — Ambulatory Visit: Payer: Medicaid Other | Admitting: Physical Therapy

## 2023-03-09 DIAGNOSIS — M5416 Radiculopathy, lumbar region: Secondary | ICD-10-CM

## 2023-03-10 DIAGNOSIS — F4312 Post-traumatic stress disorder, chronic: Secondary | ICD-10-CM | POA: Diagnosis not present

## 2023-03-14 ENCOUNTER — Other Ambulatory Visit: Payer: Medicaid Other

## 2023-03-14 DIAGNOSIS — F4312 Post-traumatic stress disorder, chronic: Secondary | ICD-10-CM | POA: Diagnosis not present

## 2023-03-15 DIAGNOSIS — F4312 Post-traumatic stress disorder, chronic: Secondary | ICD-10-CM | POA: Diagnosis not present

## 2023-03-16 ENCOUNTER — Ambulatory Visit: Payer: Medicaid Other | Attending: Obstetrics and Gynecology | Admitting: Physical Therapy

## 2023-03-19 ENCOUNTER — Ambulatory Visit
Admission: RE | Admit: 2023-03-19 | Discharge: 2023-03-19 | Disposition: A | Discharge status: Home / Self Care | Payer: Medicaid Other | Source: Ambulatory Visit | Attending: Family Medicine | Admitting: Family Medicine

## 2023-03-19 DIAGNOSIS — M5416 Radiculopathy, lumbar region: Secondary | ICD-10-CM

## 2023-03-21 DIAGNOSIS — F4312 Post-traumatic stress disorder, chronic: Secondary | ICD-10-CM | POA: Diagnosis not present

## 2023-03-21 DIAGNOSIS — J301 Allergic rhinitis due to pollen: Secondary | ICD-10-CM | POA: Diagnosis not present

## 2023-03-22 DIAGNOSIS — F4312 Post-traumatic stress disorder, chronic: Secondary | ICD-10-CM | POA: Diagnosis not present

## 2023-03-23 ENCOUNTER — Ambulatory Visit: Payer: Medicaid Other | Admitting: Physical Therapy

## 2023-03-24 DIAGNOSIS — J301 Allergic rhinitis due to pollen: Secondary | ICD-10-CM | POA: Diagnosis not present

## 2023-03-30 ENCOUNTER — Ambulatory Visit: Payer: Medicaid Other | Admitting: Physical Therapy

## 2023-03-30 DIAGNOSIS — F4312 Post-traumatic stress disorder, chronic: Secondary | ICD-10-CM | POA: Diagnosis not present

## 2023-03-31 DIAGNOSIS — J301 Allergic rhinitis due to pollen: Secondary | ICD-10-CM | POA: Diagnosis not present

## 2023-04-01 DIAGNOSIS — R0683 Snoring: Secondary | ICD-10-CM | POA: Diagnosis not present

## 2023-04-01 DIAGNOSIS — E569 Vitamin deficiency, unspecified: Secondary | ICD-10-CM | POA: Diagnosis not present

## 2023-04-04 DIAGNOSIS — F4312 Post-traumatic stress disorder, chronic: Secondary | ICD-10-CM | POA: Diagnosis not present

## 2023-04-04 DIAGNOSIS — J301 Allergic rhinitis due to pollen: Secondary | ICD-10-CM | POA: Diagnosis not present

## 2023-04-05 DIAGNOSIS — F4312 Post-traumatic stress disorder, chronic: Secondary | ICD-10-CM | POA: Diagnosis not present

## 2023-04-06 ENCOUNTER — Ambulatory Visit: Payer: Medicaid Other | Admitting: Physical Therapy

## 2023-04-08 DIAGNOSIS — J301 Allergic rhinitis due to pollen: Secondary | ICD-10-CM | POA: Diagnosis not present

## 2023-04-12 DIAGNOSIS — F4312 Post-traumatic stress disorder, chronic: Secondary | ICD-10-CM | POA: Diagnosis not present

## 2023-04-13 ENCOUNTER — Ambulatory Visit: Payer: Medicaid Other | Admitting: Physical Therapy

## 2023-04-14 ENCOUNTER — Ambulatory Visit: Payer: Medicaid Other | Admitting: Physical Therapy

## 2023-04-15 DIAGNOSIS — M5416 Radiculopathy, lumbar region: Secondary | ICD-10-CM | POA: Diagnosis not present

## 2023-04-15 DIAGNOSIS — M5412 Radiculopathy, cervical region: Secondary | ICD-10-CM | POA: Diagnosis not present

## 2023-04-18 DIAGNOSIS — F4312 Post-traumatic stress disorder, chronic: Secondary | ICD-10-CM | POA: Diagnosis not present

## 2023-04-18 DIAGNOSIS — J301 Allergic rhinitis due to pollen: Secondary | ICD-10-CM | POA: Diagnosis not present

## 2023-04-20 ENCOUNTER — Ambulatory Visit: Payer: Medicaid Other | Admitting: Physical Therapy

## 2023-04-21 ENCOUNTER — Ambulatory Visit: Payer: Medicaid Other | Admitting: Physical Therapy

## 2023-04-22 DIAGNOSIS — K760 Fatty (change of) liver, not elsewhere classified: Secondary | ICD-10-CM | POA: Diagnosis not present

## 2023-04-22 DIAGNOSIS — I10 Essential (primary) hypertension: Secondary | ICD-10-CM | POA: Diagnosis not present

## 2023-04-22 DIAGNOSIS — E119 Type 2 diabetes mellitus without complications: Secondary | ICD-10-CM | POA: Diagnosis not present

## 2023-04-22 DIAGNOSIS — F4310 Post-traumatic stress disorder, unspecified: Secondary | ICD-10-CM | POA: Diagnosis not present

## 2023-04-22 DIAGNOSIS — F419 Anxiety disorder, unspecified: Secondary | ICD-10-CM | POA: Diagnosis not present

## 2023-04-25 DIAGNOSIS — F4312 Post-traumatic stress disorder, chronic: Secondary | ICD-10-CM | POA: Diagnosis not present

## 2023-04-26 ENCOUNTER — Emergency Department: Payer: Medicaid Other

## 2023-04-26 ENCOUNTER — Emergency Department
Admission: EM | Admit: 2023-04-26 | Discharge: 2023-04-26 | Discharge status: Against Medical Advice | Payer: Medicaid Other | Attending: Emergency Medicine | Admitting: Emergency Medicine

## 2023-04-26 ENCOUNTER — Other Ambulatory Visit: Payer: Self-pay

## 2023-04-26 DIAGNOSIS — Z5321 Procedure and treatment not carried out due to patient leaving prior to being seen by health care provider: Secondary | ICD-10-CM | POA: Diagnosis not present

## 2023-04-26 DIAGNOSIS — M549 Dorsalgia, unspecified: Secondary | ICD-10-CM | POA: Diagnosis not present

## 2023-04-26 DIAGNOSIS — I10 Essential (primary) hypertension: Secondary | ICD-10-CM | POA: Insufficient documentation

## 2023-04-26 DIAGNOSIS — R072 Precordial pain: Secondary | ICD-10-CM | POA: Diagnosis present

## 2023-04-26 DIAGNOSIS — R079 Chest pain, unspecified: Secondary | ICD-10-CM | POA: Diagnosis not present

## 2023-04-26 LAB — BASIC METABOLIC PANEL
Anion gap: 13 (ref 5–15)
BUN: 14 mg/dL (ref 6–20)
CO2: 23 mmol/L (ref 22–32)
Calcium: 9.1 mg/dL (ref 8.9–10.3)
Chloride: 98 mmol/L (ref 98–111)
Creatinine, Ser: 0.58 mg/dL (ref 0.44–1.00)
GFR, Estimated: >60 mL/min (ref >60)
Glucose, Bld: 220 mg/dL — ABNORMAL HIGH (ref 70–99)
Potassium: 3.3 mmol/L — ABNORMAL LOW (ref 3.5–5.1)
Sodium: 134 mmol/L — ABNORMAL LOW (ref 135–145)

## 2023-04-26 LAB — TROPONIN I (HIGH SENSITIVITY)
Troponin I (High Sensitivity): 3 ng/L (ref <18)
Troponin I (High Sensitivity): 4 ng/L (ref <18)

## 2023-04-26 LAB — CBC
HCT: 43.3 % (ref 36.0–46.0)
Hemoglobin: 15 g/dL (ref 12.0–15.0)
MCH: 30.9 pg (ref 26.0–34.0)
MCHC: 34.6 g/dL (ref 30.0–36.0)
MCV: 89.1 fL (ref 80.0–100.0)
Platelets: 336 10*3/uL (ref 150–400)
RBC: 4.86 MIL/uL (ref 3.87–5.11)
RDW: 13.2 % (ref 11.5–15.5)
WBC: 12.6 10*3/uL — ABNORMAL HIGH (ref 4.0–10.5)
nRBC: 0 % (ref 0.0–0.2)

## 2023-04-26 NOTE — ED Triage Notes (Signed)
 Patient states hypertension at home 174/127; also complaining of mid sternal chest pain 1.5-2 hours ago. Called PCP and was told to come to ED.

## 2023-04-27 ENCOUNTER — Ambulatory Visit: Payer: Medicaid Other | Admitting: Physical Therapy

## 2023-04-27 DIAGNOSIS — D72829 Elevated white blood cell count, unspecified: Secondary | ICD-10-CM | POA: Diagnosis not present

## 2023-04-27 DIAGNOSIS — R079 Chest pain, unspecified: Secondary | ICD-10-CM | POA: Diagnosis not present

## 2023-04-27 DIAGNOSIS — R000 Tachycardia, unspecified: Secondary | ICD-10-CM | POA: Diagnosis not present

## 2023-04-27 DIAGNOSIS — Z5329 Procedure and treatment not carried out because of patient's decision for other reasons: Secondary | ICD-10-CM | POA: Diagnosis not present

## 2023-04-27 DIAGNOSIS — R7401 Elevation of levels of liver transaminase levels: Secondary | ICD-10-CM | POA: Diagnosis not present

## 2023-04-27 DIAGNOSIS — I10 Essential (primary) hypertension: Secondary | ICD-10-CM | POA: Diagnosis not present

## 2023-04-27 DIAGNOSIS — M549 Dorsalgia, unspecified: Secondary | ICD-10-CM | POA: Diagnosis not present

## 2023-04-28 ENCOUNTER — Ambulatory Visit: Payer: Medicaid Other | Admitting: Physical Therapy

## 2023-04-28 DIAGNOSIS — Z599 Problem related to housing and economic circumstances, unspecified: Secondary | ICD-10-CM | POA: Diagnosis not present

## 2023-04-28 DIAGNOSIS — F419 Anxiety disorder, unspecified: Secondary | ICD-10-CM | POA: Diagnosis not present

## 2023-04-28 DIAGNOSIS — M549 Dorsalgia, unspecified: Secondary | ICD-10-CM | POA: Diagnosis not present

## 2023-04-28 DIAGNOSIS — F4310 Post-traumatic stress disorder, unspecified: Secondary | ICD-10-CM | POA: Diagnosis not present

## 2023-04-28 DIAGNOSIS — E119 Type 2 diabetes mellitus without complications: Secondary | ICD-10-CM | POA: Diagnosis not present

## 2023-04-28 DIAGNOSIS — J301 Allergic rhinitis due to pollen: Secondary | ICD-10-CM | POA: Diagnosis not present

## 2023-04-28 DIAGNOSIS — I10 Essential (primary) hypertension: Secondary | ICD-10-CM | POA: Diagnosis not present

## 2023-05-02 DIAGNOSIS — F4312 Post-traumatic stress disorder, chronic: Secondary | ICD-10-CM | POA: Diagnosis not present

## 2023-05-02 DIAGNOSIS — J301 Allergic rhinitis due to pollen: Secondary | ICD-10-CM | POA: Diagnosis not present

## 2023-05-04 ENCOUNTER — Ambulatory Visit: Payer: Medicaid Other | Admitting: Physical Therapy

## 2023-05-05 ENCOUNTER — Ambulatory Visit: Payer: Medicaid Other | Admitting: Physical Therapy

## 2023-05-09 DIAGNOSIS — F4312 Post-traumatic stress disorder, chronic: Secondary | ICD-10-CM | POA: Diagnosis not present

## 2023-05-13 DIAGNOSIS — I10 Essential (primary) hypertension: Secondary | ICD-10-CM | POA: Diagnosis not present

## 2023-05-13 DIAGNOSIS — E119 Type 2 diabetes mellitus without complications: Secondary | ICD-10-CM | POA: Diagnosis not present

## 2023-05-16 DIAGNOSIS — F4312 Post-traumatic stress disorder, chronic: Secondary | ICD-10-CM | POA: Diagnosis not present

## 2023-05-18 DIAGNOSIS — M7918 Myalgia, other site: Secondary | ICD-10-CM | POA: Diagnosis not present

## 2023-05-20 DIAGNOSIS — Z0100 Encounter for examination of eyes and vision without abnormal findings: Secondary | ICD-10-CM | POA: Diagnosis not present

## 2023-05-20 DIAGNOSIS — E119 Type 2 diabetes mellitus without complications: Secondary | ICD-10-CM | POA: Diagnosis not present

## 2023-05-20 DIAGNOSIS — K760 Fatty (change of) liver, not elsewhere classified: Secondary | ICD-10-CM | POA: Diagnosis not present

## 2023-05-20 DIAGNOSIS — E785 Hyperlipidemia, unspecified: Secondary | ICD-10-CM | POA: Diagnosis not present

## 2023-05-20 DIAGNOSIS — I10 Essential (primary) hypertension: Secondary | ICD-10-CM | POA: Diagnosis not present

## 2023-05-23 DIAGNOSIS — F4312 Post-traumatic stress disorder, chronic: Secondary | ICD-10-CM | POA: Diagnosis not present

## 2023-06-13 DIAGNOSIS — F4312 Post-traumatic stress disorder, chronic: Secondary | ICD-10-CM | POA: Diagnosis not present

## 2023-06-20 DIAGNOSIS — F4312 Post-traumatic stress disorder, chronic: Secondary | ICD-10-CM | POA: Diagnosis not present

## 2023-06-21 DIAGNOSIS — G932 Benign intracranial hypertension: Secondary | ICD-10-CM | POA: Diagnosis not present

## 2023-06-27 DIAGNOSIS — F4312 Post-traumatic stress disorder, chronic: Secondary | ICD-10-CM | POA: Diagnosis not present

## 2023-07-04 DIAGNOSIS — F4312 Post-traumatic stress disorder, chronic: Secondary | ICD-10-CM | POA: Diagnosis not present

## 2023-07-11 DIAGNOSIS — F4312 Post-traumatic stress disorder, chronic: Secondary | ICD-10-CM | POA: Diagnosis not present

## 2023-07-18 DIAGNOSIS — F4312 Post-traumatic stress disorder, chronic: Secondary | ICD-10-CM | POA: Diagnosis not present

## 2023-08-01 DIAGNOSIS — F4312 Post-traumatic stress disorder, chronic: Secondary | ICD-10-CM | POA: Diagnosis not present

## 2023-08-15 DIAGNOSIS — F4312 Post-traumatic stress disorder, chronic: Secondary | ICD-10-CM | POA: Diagnosis not present

## 2023-08-19 DIAGNOSIS — E785 Hyperlipidemia, unspecified: Secondary | ICD-10-CM | POA: Diagnosis not present

## 2023-08-19 DIAGNOSIS — E119 Type 2 diabetes mellitus without complications: Secondary | ICD-10-CM | POA: Diagnosis not present

## 2023-08-19 DIAGNOSIS — K760 Fatty (change of) liver, not elsewhere classified: Secondary | ICD-10-CM | POA: Diagnosis not present

## 2023-08-19 DIAGNOSIS — I10 Essential (primary) hypertension: Secondary | ICD-10-CM | POA: Diagnosis not present

## 2023-08-19 DIAGNOSIS — G932 Benign intracranial hypertension: Secondary | ICD-10-CM | POA: Diagnosis not present

## 2023-08-22 DIAGNOSIS — F4312 Post-traumatic stress disorder, chronic: Secondary | ICD-10-CM | POA: Diagnosis not present

## 2023-08-29 DIAGNOSIS — F4312 Post-traumatic stress disorder, chronic: Secondary | ICD-10-CM | POA: Diagnosis not present

## 2023-09-05 DIAGNOSIS — F4312 Post-traumatic stress disorder, chronic: Secondary | ICD-10-CM | POA: Diagnosis not present

## 2023-09-13 DIAGNOSIS — F4312 Post-traumatic stress disorder, chronic: Secondary | ICD-10-CM | POA: Diagnosis not present

## 2023-09-20 DIAGNOSIS — F4312 Post-traumatic stress disorder, chronic: Secondary | ICD-10-CM | POA: Diagnosis not present

## 2023-09-27 DIAGNOSIS — F4312 Post-traumatic stress disorder, chronic: Secondary | ICD-10-CM | POA: Diagnosis not present

## 2023-09-28 DIAGNOSIS — S3141XS Laceration without foreign body of vagina and vulva, sequela: Secondary | ICD-10-CM | POA: Diagnosis not present

## 2023-09-28 DIAGNOSIS — R300 Dysuria: Secondary | ICD-10-CM | POA: Diagnosis not present

## 2023-09-28 DIAGNOSIS — N898 Other specified noninflammatory disorders of vagina: Secondary | ICD-10-CM | POA: Diagnosis not present

## 2023-10-03 DIAGNOSIS — F322 Major depressive disorder, single episode, severe without psychotic features: Secondary | ICD-10-CM | POA: Diagnosis not present

## 2023-10-03 DIAGNOSIS — F419 Anxiety disorder, unspecified: Secondary | ICD-10-CM | POA: Diagnosis not present

## 2023-10-03 DIAGNOSIS — E785 Hyperlipidemia, unspecified: Secondary | ICD-10-CM | POA: Diagnosis not present

## 2023-10-03 DIAGNOSIS — R635 Abnormal weight gain: Secondary | ICD-10-CM | POA: Diagnosis not present

## 2023-10-04 DIAGNOSIS — F4312 Post-traumatic stress disorder, chronic: Secondary | ICD-10-CM | POA: Diagnosis not present

## 2023-10-11 DIAGNOSIS — F4312 Post-traumatic stress disorder, chronic: Secondary | ICD-10-CM | POA: Diagnosis not present

## 2023-10-18 DIAGNOSIS — R7989 Other specified abnormal findings of blood chemistry: Secondary | ICD-10-CM | POA: Diagnosis not present

## 2023-10-18 DIAGNOSIS — F4312 Post-traumatic stress disorder, chronic: Secondary | ICD-10-CM | POA: Diagnosis not present

## 2023-10-20 DIAGNOSIS — I10 Essential (primary) hypertension: Secondary | ICD-10-CM | POA: Diagnosis not present

## 2023-10-20 DIAGNOSIS — E785 Hyperlipidemia, unspecified: Secondary | ICD-10-CM | POA: Diagnosis not present

## 2023-10-20 DIAGNOSIS — E119 Type 2 diabetes mellitus without complications: Secondary | ICD-10-CM | POA: Diagnosis not present

## 2023-10-20 DIAGNOSIS — K760 Fatty (change of) liver, not elsewhere classified: Secondary | ICD-10-CM | POA: Diagnosis not present

## 2023-10-25 DIAGNOSIS — F4312 Post-traumatic stress disorder, chronic: Secondary | ICD-10-CM | POA: Diagnosis not present

## 2023-11-01 DIAGNOSIS — F4312 Post-traumatic stress disorder, chronic: Secondary | ICD-10-CM | POA: Diagnosis not present

## 2023-11-08 DIAGNOSIS — F4312 Post-traumatic stress disorder, chronic: Secondary | ICD-10-CM | POA: Diagnosis not present

## 2023-11-23 ENCOUNTER — Ambulatory Visit: Payer: Self-pay | Admitting: Psychiatry

## 2023-11-23 DIAGNOSIS — F4312 Post-traumatic stress disorder, chronic: Secondary | ICD-10-CM | POA: Diagnosis not present

## 2023-12-08 ENCOUNTER — Ambulatory Visit: Admitting: Dietician

## 2023-12-13 ENCOUNTER — Ambulatory Visit (INDEPENDENT_AMBULATORY_CARE_PROVIDER_SITE_OTHER): Admitting: Psychiatry

## 2023-12-13 ENCOUNTER — Encounter: Payer: Self-pay | Admitting: Psychiatry

## 2023-12-13 VITALS — BP 138/98 | HR 111 | Temp 98.4°F | Ht 62.0 in | Wt 171.0 lb

## 2023-12-13 DIAGNOSIS — F4310 Post-traumatic stress disorder, unspecified: Secondary | ICD-10-CM | POA: Diagnosis not present

## 2023-12-13 DIAGNOSIS — F332 Major depressive disorder, recurrent severe without psychotic features: Secondary | ICD-10-CM | POA: Diagnosis not present

## 2023-12-13 DIAGNOSIS — Z9189 Other specified personal risk factors, not elsewhere classified: Secondary | ICD-10-CM | POA: Insufficient documentation

## 2023-12-13 DIAGNOSIS — Z79899 Other long term (current) drug therapy: Secondary | ICD-10-CM | POA: Insufficient documentation

## 2023-12-13 DIAGNOSIS — F603 Borderline personality disorder: Secondary | ICD-10-CM | POA: Diagnosis not present

## 2023-12-13 DIAGNOSIS — R41840 Attention and concentration deficit: Secondary | ICD-10-CM | POA: Diagnosis not present
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
## 2023-12-13 NOTE — Patient Instructions (Addendum)
 For DBT THERAPY  [email or web address] Counseling & mental health in [place], [place] [address] [place], [place], [place] [address]   Open  Closes 7 PM  More hours [phone]   Please walk in to [facility]  [email or web address] Counseling & mental health in [place], [place] [address], [place], [place] [address]

## 2023-12-22 ENCOUNTER — Emergency Department
Admission: EM | Admit: 2023-12-22 | Discharge: 2023-12-23 | Disposition: A | Discharge status: Home / Self Care | Attending: Emergency Medicine | Admitting: Emergency Medicine

## 2023-12-22 ENCOUNTER — Emergency Department

## 2023-12-22 ENCOUNTER — Other Ambulatory Visit: Payer: Self-pay

## 2023-12-22 DIAGNOSIS — J45901 Unspecified asthma with (acute) exacerbation: Secondary | ICD-10-CM | POA: Insufficient documentation

## 2023-12-22 DIAGNOSIS — Z720 Tobacco use: Secondary | ICD-10-CM | POA: Insufficient documentation

## 2023-12-22 DIAGNOSIS — R0602 Shortness of breath: Secondary | ICD-10-CM | POA: Diagnosis present

## 2023-12-22 DIAGNOSIS — R509 Fever, unspecified: Secondary | ICD-10-CM | POA: Insufficient documentation

## 2023-12-22 DIAGNOSIS — J4521 Mild intermittent asthma with (acute) exacerbation: Secondary | ICD-10-CM

## 2023-12-22 DIAGNOSIS — R000 Tachycardia, unspecified: Secondary | ICD-10-CM | POA: Diagnosis not present

## 2023-12-22 LAB — COMPREHENSIVE METABOLIC PANEL WITH GFR
ALT: 62 U/L — ABNORMAL HIGH (ref 0–44)
AST: 45 U/L — ABNORMAL HIGH (ref 15–41)
Albumin: 4.6 g/dL (ref 3.5–5.0)
Alkaline Phosphatase: 68 U/L (ref 38–126)
Anion gap: 12 (ref 5–15)
BUN: 11 mg/dL (ref 6–20)
CO2: 21 mmol/L — ABNORMAL LOW (ref 22–32)
Calcium: 8.9 mg/dL (ref 8.9–10.3)
Chloride: 101 mmol/L (ref 98–111)
Creatinine, Ser: 0.5 mg/dL (ref 0.44–1.00)
GFR, Estimated: >60 mL/min (ref >60)
Glucose, Bld: 114 mg/dL — ABNORMAL HIGH (ref 70–99)
Potassium: 3.8 mmol/L (ref 3.5–5.1)
Sodium: 134 mmol/L — ABNORMAL LOW (ref 135–145)
Total Bilirubin: 0.7 mg/dL (ref 0.0–1.2)
Total Protein: 8.9 g/dL — ABNORMAL HIGH (ref 6.5–8.1)

## 2023-12-22 LAB — CBC WITH DIFFERENTIAL/PLATELET
Abs Immature Granulocytes: 0.07 K/uL (ref 0.00–0.07)
Basophils Absolute: 0.1 K/uL (ref 0.0–0.1)
Basophils Relative: 1 %
Eosinophils Absolute: 0.9 K/uL — ABNORMAL HIGH (ref 0.0–0.5)
Eosinophils Relative: 6 %
HCT: 41.7 % (ref 36.0–46.0)
Hemoglobin: 14.2 g/dL (ref 12.0–15.0)
Immature Granulocytes: 1 %
Lymphocytes Relative: 24 %
Lymphs Abs: 3.7 K/uL (ref 0.7–4.0)
MCH: 30.4 pg (ref 26.0–34.0)
MCHC: 34.1 g/dL (ref 30.0–36.0)
MCV: 89.3 fL (ref 80.0–100.0)
Monocytes Absolute: 1.3 K/uL — ABNORMAL HIGH (ref 0.1–1.0)
Monocytes Relative: 8 %
Neutro Abs: 9.4 K/uL — ABNORMAL HIGH (ref 1.7–7.7)
Neutrophils Relative %: 60 %
Platelets: 365 K/uL (ref 150–400)
RBC: 4.67 MIL/uL (ref 3.87–5.11)
RDW: 13.7 % (ref 11.5–15.5)
WBC: 15.4 K/uL — ABNORMAL HIGH (ref 4.0–10.5)
nRBC: 0 % (ref 0.0–0.2)

## 2023-12-22 LAB — RESP PANEL BY RT-PCR (RSV, FLU A&B, COVID)  RVPGX2
Influenza A by PCR: NEGATIVE
Influenza B by PCR: NEGATIVE
Resp Syncytial Virus by PCR: NEGATIVE
SARS Coronavirus 2 by RT PCR: NEGATIVE

## 2023-12-22 LAB — LACTIC ACID, PLASMA: Lactic Acid, Venous: 1.6 mmol/L (ref 0.5–1.9)

## 2023-12-22 LAB — CBG MONITORING, ED: Glucose-Capillary: 159 mg/dL — ABNORMAL HIGH (ref 70–99)

## 2023-12-22 MED ORDER — DOXYCYCLINE HYCLATE 100 MG PO TABS
100.0000 mg | ORAL_TABLET | Freq: Once | ORAL | Status: AC
  Administered 2023-12-22: 100 mg via ORAL
  Filled 2023-12-22: qty 1

## 2023-12-22 MED ORDER — IPRATROPIUM-ALBUTEROL 0.5-2.5 (3) MG/3ML IN SOLN
9.0000 mL | Freq: Once | RESPIRATORY_TRACT | Status: AC
  Administered 2023-12-22: 9 mL via RESPIRATORY_TRACT
  Filled 2023-12-22: qty 9

## 2023-12-22 MED ORDER — ACETAMINOPHEN 500 MG PO TABS
1000.0000 mg | ORAL_TABLET | Freq: Once | ORAL | Status: AC
  Administered 2023-12-22: 1000 mg via ORAL
  Filled 2023-12-22: qty 2

## 2023-12-22 MED ORDER — METHYLPREDNISOLONE SODIUM SUCC 125 MG IJ SOLR
125.0000 mg | Freq: Once | INTRAMUSCULAR | Status: AC
  Administered 2023-12-22: 125 mg via INTRAVENOUS
  Filled 2023-12-22: qty 2

## 2023-12-22 MED ORDER — SODIUM CHLORIDE 0.9 % IV BOLUS
1000.0000 mL | Freq: Once | INTRAVENOUS | Status: AC
  Administered 2023-12-22: 1000 mL via INTRAVENOUS

## 2023-12-22 NOTE — ED Triage Notes (Signed)
 First nurse note: Pt to ED via POV from [facility]. Pt reports CP, SOB and HTN. 99% RA PTA. Pt reports all started with a cough and has gotten worse.   155/120 HR 128 99% RA 100.3 oral

## 2023-12-22 NOTE — ED Notes (Signed)
 Dr. Suzanne notified that pt's lung sounds sound very tight and wheezing present. Provider going to room now

## 2023-12-22 NOTE — ED Notes (Signed)
 Ambulated patient from stretcher to restroom in ED room. Patients oxygen saturation dropped from 97% to 93% on RA. Patient denied dizziness, but did endorse mild shortness of breath.

## 2023-12-22 NOTE — ED Provider Notes (Signed)
 [facility] Provider Note    Event Date/Time   First MD Initiated Contact with Patient 12/22/23 2112     (approximate)   History   Cough, Nasal Congestion, Shortness of Breath, and Chest Pain   HPI  Joyce Swanson is a 33 y.o. female past medical history significant for tobacco use, asthma, who presents to the emergency department shortness of breath.  Patient states that she has been having shortness of breath and cough over the past week.  Significant shortness of breath.  Denies any abdominal pain, nausea vomiting or diarrhea.  Denies any dysuria but states that she is not incontinent of urine every time she coughs.  Endorses fever that started today.  Denies recent travel.  No history of DVT or PE.  Denies any concern for pregnancy and had a prior hysterectomy.  No recent travel.  No surgeries.  Ongoing tobacco use.     Physical Exam   Triage Vital Signs: ED Triage Vitals  Encounter Vitals Group     BP 12/22/23 1848 (!) 158/113     Girls Systolic BP Percentile --      Girls Diastolic BP Percentile --      Boys Systolic BP Percentile --      Boys Diastolic BP Percentile --      Pulse Rate 12/22/23 1848 (!) 121     Resp 12/22/23 1848 (!) 22     Temp 12/22/23 1848 (!) 101.3 F (38.5 C)     Temp Source 12/22/23 2119 Oral     SpO2 12/22/23 1848 99 %     Weight --      Height --      Head Circumference --      Peak Flow --      Pain Score 12/22/23 1850 9     Pain Loc --      Pain Education --      Exclude from Growth Chart --     Most recent vital signs: Vitals:   12/22/23 2334 12/23/23 0109  BP:  123/89  Pulse:  (!) 103  Resp:  16  Temp: 99.7 F (37.6 C) 99.6 F (37.6 C)  SpO2:  97%    Physical Exam Constitutional:      Appearance: She is well-developed. She is ill-appearing.  HENT:     Head: Atraumatic.  Eyes:     Conjunctiva/sclera: Conjunctivae normal.  Cardiovascular:     Rate and Rhythm: Regular rhythm. Tachycardia  present.  Pulmonary:     Effort: Tachypnea and respiratory distress present.     Breath sounds: Wheezing present.  Abdominal:     General: There is no distension.     Palpations: Abdomen is soft.     Tenderness: There is no abdominal tenderness.  Musculoskeletal:        General: Normal range of motion.     Cervical back: Normal range of motion.     Left lower leg: No edema.  Skin:    General: Skin is warm.     Capillary Refill: Capillary refill takes less than 2 seconds.  Neurological:     General: No focal deficit present.     Mental Status: She is alert. Mental status is at baseline.     IMPRESSION / MDM / ASSESSMENT AND PLAN / ED COURSE  I reviewed the triage vital signs and the nursing notes.  On arrival patient febrile, tachycardic and hypertensive  Differential diagnosis including pneumonia, asthma exacerbation, viral illness including COVID/influenza,  pneumonia, dehydration, electrolyte abnormality  Given Tylenol   EKG  I, Clotilda Punter, the attending physician, personally viewed and interpreted this ECG.  Sinus tachycardia.  Heart rate 125.  Right axis.  QRS is narrow at 62.  QTc 435.  Nonspecific ST changes.  No significant ST elevation or depression.  Wandering baseline.  Sinus tachycardia while on cardiac telemetry.  RADIOLOGY Chest x-ray with no acute findings of pneumonia.  No signs of pulmonary edema. LABS (all labs ordered are listed, but only abnormal results are displayed) Labs interpreted as -    Labs Reviewed  URINALYSIS, W/ REFLEX TO CULTURE (INFECTION SUSPECTED) - Abnormal; Notable for the following components:      Result Value   Color, Urine YELLOW (*)    APPearance HAZY (*)    All other components within normal limits  CBC WITH DIFFERENTIAL/PLATELET - Abnormal; Notable for the following components:   WBC 15.4 (*)    Neutro Abs 9.4 (*)    Monocytes Absolute 1.3 (*)    Eosinophils Absolute 0.9 (*)    All other components within normal  limits  COMPREHENSIVE METABOLIC PANEL WITH GFR - Abnormal; Notable for the following components:   Sodium 134 (*)    CO2 21 (*)    Glucose, Bld 114 (*)    Total Protein 8.9 (*)    AST 45 (*)    ALT 62 (*)    All other components within normal limits  CBG MONITORING, ED - Abnormal; Notable for the following components:   Glucose-Capillary 159 (*)    All other components within normal limits  RESP PANEL BY RT-PCR (RSV, FLU A&B, COVID)  RVPGX2  CULTURE, BLOOD (ROUTINE X 2)  CULTURE, BLOOD (ROUTINE X 2)  LACTIC ACID, PLASMA  CBC WITH DIFFERENTIAL/PLATELET     MDM  Patient with leukocytosis.  Creatinine at baseline with no significant electrolyte abnormality.  COVID and influenza testing are negative.  Chest x-ray with no focal findings consistent with pneumonia.  Clinical picture is most concerning for an asthma exacerbation.  Patient was given Tylenol , DuoNebs and IV Solu-Medrol.  Will give a liter of IV fluids and reevaluate.  Urine with no signs of urinary tract infection.  On reevaluation patient states she is feeling much better wants to go home.  Continues to be tachycardic but did have multiple DuoNeb treatments.  Patient with an ongoing cough, no pleuritic chest pain, did have wheezing on exam and no other risk factors for pulmonary embolism, have a lower suspicion for pulmonary embolism.  Ambulated to the bathroom with no hypoxia and no significant increased work of breathing.  Discussed admission given her ongoing increased work of breathing and tachycardia however patient states that she has to go home because her husband works Advertising account executive and she wants to watch the children.  Given first dose of antibiotic in the emergency department and clinical picture is most consistent for asthma exacerbation from upper respiratory infection.  Stated that she would return immediately for shortness of breath worsened.  States that she has plenty of albuterol  at home.  Discussed close follow-up  with primary care physician and immediate return if she had any worsening symptoms.  Given a prescription for prednisone and doxycycline.  No questions at time of discharge.     PROCEDURES:  Critical Care performed: No  Procedures  Patient's presentation is most consistent with acute presentation with potential threat to life or bodily function.   MEDICATIONS ORDERED IN ED: Medications  acetaminophen  (TYLENOL ) tablet 1,000 mg (  1,000 mg Oral Given 12/22/23 2112)  ipratropium-albuterol  (DUONEB) 0.5-2.5 (3) MG/3ML nebulizer solution 9 mL (9 mLs Nebulization Given 12/22/23 2140)  methylPREDNISolone sodium succinate (SOLU-MEDROL) 125 mg/2 mL injection 125 mg (125 mg Intravenous Given 12/22/23 2142)  sodium chloride  0.9 % bolus 1,000 mL (0 mLs Intravenous Stopped 12/23/23 0110)  doxycycline (VIBRA-TABS) tablet 100 mg (100 mg Oral Given 12/22/23 2213)    FINAL CLINICAL IMPRESSION(S) / ED DIAGNOSES   Final diagnoses:  Fever, unspecified fever cause  Exacerbation of intermittent asthma, unspecified asthma severity     Rx / DC Orders   ED Discharge Orders          Ordered    doxycycline (ADOXA) 100 MG tablet  2 times daily        12/23/23 0034    predniSONE (DELTASONE) 50 MG tablet  Daily with breakfast        12/23/23 0034             Note:  This document was prepared using Dragon voice recognition software and may include unintentional dictation errors.   Suzanne Kirsch, MD 12/23/23 [phone]

## 2023-12-23 LAB — URINALYSIS, W/ REFLEX TO CULTURE (INFECTION SUSPECTED)
Bacteria, UA: NONE SEEN
Bilirubin Urine: NEGATIVE
Glucose, UA: NEGATIVE mg/dL
Hgb urine dipstick: NEGATIVE
Ketones, ur: NEGATIVE mg/dL
Leukocytes,Ua: NEGATIVE
Nitrite: NEGATIVE
Protein, ur: NEGATIVE mg/dL
Specific Gravity, Urine: 1.012 (ref 1.005–1.030)
pH: 5 (ref 5.0–8.0)

## 2023-12-23 MED ORDER — DOXYCYCLINE MONOHYDRATE 100 MG PO TABS: 100.0000 mg | ORAL_TABLET | Freq: Two times a day (BID) | ORAL | 0 refills | Status: AC

## 2023-12-23 MED ORDER — PREDNISONE 50 MG PO TABS: 50.0000 mg | ORAL_TABLET | Freq: Every day | ORAL | 0 refills | Status: AC

## 2023-12-23 NOTE — Discharge Instructions (Signed)
 You are seen in the emergency department for not feeling well with shortness of breath and cough.  You had a fever and findings concerning for an asthma exacerbation.  You had a chest x-ray that did not show any signs of pneumonia.  Your COVID and flu test were negative.  You are given breathing treatments and IV fluids in the emergency department and your first dose of antibiotics.  We discussed admission but you wanted to go home and return to the emergency department if your symptoms worsened.  : Follow-up closely with your primary care physician.  Return to the emergency department if you have any worsening symptoms.  You are given a prescription for antibiotics, steroids.  Use 2 to 4 puffs of albuterol  inhaler every 2-4 hours as needed for shortness of breath.  Prednisone -you are given a prescription for a steroid.  It is important that you take this medication with food.  This medication can cause an upset stomach.  It also can increase your glucose if you have a history of diabetes, so it is important that you check your glucose frequently while you are on this medication.  Doxycycline - This medication can cause acid reflux.  It is important that you take it with food and drink plenty of water.  Do not lie down for 1 hour after taking this medication.  It also causes sun sensitivity so stay out of the sun or wear SPF while on this medication.

## 2023-12-27 LAB — CULTURE, BLOOD (ROUTINE X 2)
Culture: NO GROWTH
Culture: NO GROWTH

## 2024-01-16 ENCOUNTER — Ambulatory Visit: Admitting: Dietician

## 2024-02-08 ENCOUNTER — Encounter: Admitting: Dietician

## 2024-03-12 ENCOUNTER — Encounter: Admitting: Dietician
